# Patient Record
Sex: Male | Born: 1985 | Race: White | Hispanic: No | Marital: Married | Smoking: Former smoker
Health system: Southern US, Community
[De-identification: ages and names within clinical notes are randomized; demographics above are authoritative.]

## PROBLEM LIST (undated history)

## (undated) DIAGNOSIS — K219 Gastro-esophageal reflux disease without esophagitis: Secondary | ICD-10-CM

## (undated) DIAGNOSIS — I82409 Acute embolism and thrombosis of unspecified deep veins of unspecified lower extremity: Secondary | ICD-10-CM

## (undated) DIAGNOSIS — N200 Calculus of kidney: Secondary | ICD-10-CM

## (undated) DIAGNOSIS — I10 Essential (primary) hypertension: Secondary | ICD-10-CM

## (undated) HISTORY — PX: ANKLE SURGERY: SHX546

## (undated) HISTORY — PX: MOUTH SURGERY: SHX715

## (undated) HISTORY — DX: Gastro-esophageal reflux disease without esophagitis: K21.9

---

## 2019-10-11 ENCOUNTER — Emergency Department (HOSPITAL_COMMUNITY): Payer: Medicaid Other

## 2019-10-11 ENCOUNTER — Encounter (HOSPITAL_COMMUNITY): Payer: Self-pay | Admitting: *Deleted

## 2019-10-11 ENCOUNTER — Other Ambulatory Visit: Payer: Self-pay

## 2019-10-11 ENCOUNTER — Emergency Department (HOSPITAL_COMMUNITY)
Admission: EM | Admit: 2019-10-11 | Discharge: 2019-10-11 | Disposition: A | Discharge status: Home / Self Care | Payer: Medicaid Other | Attending: Emergency Medicine | Admitting: Emergency Medicine

## 2019-10-11 DIAGNOSIS — Y939 Activity, unspecified: Secondary | ICD-10-CM | POA: Diagnosis not present

## 2019-10-11 DIAGNOSIS — I10 Essential (primary) hypertension: Secondary | ICD-10-CM | POA: Diagnosis not present

## 2019-10-11 DIAGNOSIS — F17200 Nicotine dependence, unspecified, uncomplicated: Secondary | ICD-10-CM | POA: Diagnosis not present

## 2019-10-11 DIAGNOSIS — Y929 Unspecified place or not applicable: Secondary | ICD-10-CM | POA: Insufficient documentation

## 2019-10-11 DIAGNOSIS — W1842XA Slipping, tripping and stumbling without falling due to stepping into hole or opening, initial encounter: Secondary | ICD-10-CM | POA: Diagnosis not present

## 2019-10-11 DIAGNOSIS — S93402A Sprain of unspecified ligament of left ankle, initial encounter: Secondary | ICD-10-CM | POA: Insufficient documentation

## 2019-10-11 DIAGNOSIS — Y999 Unspecified external cause status: Secondary | ICD-10-CM | POA: Insufficient documentation

## 2019-10-11 DIAGNOSIS — S99912A Unspecified injury of left ankle, initial encounter: Secondary | ICD-10-CM | POA: Diagnosis present

## 2019-10-11 HISTORY — DX: Essential (primary) hypertension: I10

## 2019-10-11 MED ORDER — KETOROLAC TROMETHAMINE 60 MG/2ML IM SOLN
60.0000 mg | Freq: Once | INTRAMUSCULAR | Status: AC
  Administered 2019-10-11: 60 mg via INTRAMUSCULAR
  Filled 2019-10-11: qty 2

## 2019-10-11 NOTE — Discharge Instructions (Addendum)
Please follow-up with your primary care provider regarding today's encounter.  I recommend that you take ibuprofen and Tylenol as needed for symptoms discomfort.  Please use the crutches, as needed.  Weightbearing as tolerated.  Your x-ray was negative for fracture today.  Suspect this is a sprain.  However, there is always the possibility of missed fractures on x-ray.  I referred you to Dr. Aline Brochure, orthopedist, for ongoing evaluation and management if your symptoms fail to improve with conservative therapy.  Return to the ED or seek immediate medical attention should experience any new or worsening symptoms.

## 2019-10-11 NOTE — ED Provider Notes (Signed)
[facility] Provider Note   CSN: [id number] Arrival date & time: 10/11/19  1216     History Chief Complaint  Patient presents with  . Ankle Pain    Mark Glenn is a 34 y.o. male with no relevant past medical history who presents to the ED with complaints of left ankle pain.  Patient reports that he had his surgically repaired right ankle, but has never had any issues with his left ankle.  He was working [place] at a wrestling event yesterday when he was stepping backwards with his left foot and it "gave out" underneath him.  He states that he was able to finish the event.  This morning, he noticed swelling and discomfort.  His wife encouraged him to come to the ED for evaluation.  He states that he has been unable to ambulate without a brace, however was able to walk into the ER here today for evaluation.  He wanted to ensure that there were no fractures.  He denies any weakness, numbness, bruising, blood thinner use, fevers or chills, or other symptoms.  HPI     Past Medical History:  Diagnosis Date  . Hypertension     There are no problems to display for this patient.   Past Surgical History:  Procedure Laterality Date  . ANKLE SURGERY     right  . MOUTH SURGERY         No family history on file.  Social History   Tobacco Use  . Smoking status: Current Every Day Smoker  . Smokeless tobacco: Never Used  Substance Use Topics  . Alcohol use: Not on file  . Drug use: Not on file    Home Medications Prior to Admission medications   Not on File    Allergies    Patient has no known allergies.  Review of Systems   Review of Systems  Constitutional: Negative for fever.  Musculoskeletal: Positive for arthralgias, gait problem and joint swelling.  Skin: Negative for color change and wound.  Neurological: Negative for weakness and numbness.  Hematological: Does not bruise/bleed easily.    Physical Exam Updated Vital Signs BP 138/73    Pulse 62   Temp 98.1 F (36.7 C) (Oral)   Resp 16   Ht 5' 9"  (1.753 m)   Wt 98.4 kg   SpO2 100%   BMI 32.05 kg/m   Physical Exam Vitals and nursing note reviewed. Exam conducted with a chaperone present.  Constitutional:      Appearance: Normal appearance.  HENT:     Head: Normocephalic and atraumatic.  Eyes:     General: No scleral icterus.    Conjunctiva/sclera: Conjunctivae normal.  Cardiovascular:     Rate and Rhythm: Normal rate and regular rhythm.     Pulses: Normal pulses.     Heart sounds: Normal heart sounds.  Pulmonary:     Effort: Pulmonary effort is normal.  Musculoskeletal:     Cervical back: Normal range of motion. No rigidity.     Comments: Left ankle: TTP over lateral malleolus.  Able to plantar flex and dorsiflex, but limited due to pain symptoms.  No significant swelling or overlying skin changes appreciated.  No warmth or redness.  Able to ambulate, albeit with antalgia.  Negative Thompson's test.  No Achilles defect or tenderness.  Sensation intact throughout.  Pedal pulse and capillary refill intact.  No wounds. Left knee: Normal.  No proximal fibular tenderness. Left hip: Normal.  Skin:    General:  Skin is dry.     Capillary Refill: Capillary refill takes less than 2 seconds.  Neurological:     General: No focal deficit present.     Mental Status: He is alert and oriented to person, place, and time.     GCS: GCS eye subscore is 4. GCS verbal subscore is 5. GCS motor subscore is 6.  Psychiatric:        Mood and Affect: Mood normal.        Behavior: Behavior normal.        Thought Content: Thought content normal.     ED Results / Procedures / Treatments   Labs (all labs ordered are listed, but only abnormal results are displayed) Labs Reviewed - No data to display  EKG None  Radiology DG Ankle Complete Left  Result Date: 10/11/2019 CLINICAL DATA:  Pain in LEFT ankle. EXAM: LEFT ANKLE COMPLETE - 3+ VIEW COMPARISON:  None FINDINGS: Degenerative  changes about the LEFT ankle. No significant soft tissue swelling. Ankle mortise is preserved. No sign of joint effusion. IMPRESSION: No acute fracture or dislocation. Degenerative changes about the LEFT ankle. Electronically Signed   By: Zetta Bills M.D.   On: 10/11/2019 13:36    Procedures Procedures (including critical care time)  Medications Ordered in ED Medications  ketorolac (TORADOL) injection 60 mg (has no administration in time range)    ED Course  I have reviewed the triage vital signs and the nursing notes.  Pertinent labs & imaging results that were available during my care of the patient were reviewed by me and considered in my medical decision making (see chart for details).    MDM Rules/Calculators/A&P                          Plain films are personally reviewed and demonstrate no acute fractures or other osseous abnormalities.  There are some mild degenerative changes noted.  Patient endorses a history of playing football and other strenuous vigorous activity that he suspects may be contributing factor to arthritic changes.  Patient is reassured by today's work-up.  I cautioned him that there is always a risk of missed fractures on plain films.  Will place in ASO splint and provide crutches.  Will provide Toradol IM here in the ED.  Will refer to local orthopedist, Dr. Aline Brochure, for ongoing evaluation and management.  Strict ED return precautions discussed.  Patient voices understanding and is agreeable to the plan.     Final Clinical Impression(s) / ED Diagnoses Final diagnoses:  Sprain of left ankle, unspecified ligament, initial encounter    Rx / DC Orders ED Discharge Orders    None       Corena Herter, PA-C 10/11/19 1519    Noemi Chapel, MD 10/12/19 [phone]

## 2019-10-11 NOTE — ED Triage Notes (Signed)
Pt c/o "stepping wrong" on left ankle last night.

## 2020-03-13 ENCOUNTER — Emergency Department (HOSPITAL_COMMUNITY)
Admission: EM | Admit: 2020-03-13 | Discharge: 2020-03-13 | Disposition: A | Discharge status: Home / Self Care | Payer: Medicaid Other | Attending: Emergency Medicine | Admitting: Emergency Medicine

## 2020-03-13 ENCOUNTER — Other Ambulatory Visit: Payer: Self-pay

## 2020-03-13 ENCOUNTER — Encounter (HOSPITAL_COMMUNITY): Payer: Self-pay

## 2020-03-13 DIAGNOSIS — Z5321 Procedure and treatment not carried out due to patient leaving prior to being seen by health care provider: Secondary | ICD-10-CM | POA: Diagnosis not present

## 2020-03-13 DIAGNOSIS — K0889 Other specified disorders of teeth and supporting structures: Secondary | ICD-10-CM | POA: Diagnosis not present

## 2020-03-13 NOTE — ED Triage Notes (Signed)
Pt to er, pt states that he is here for dental pain, states that he has had the pain for the past week, states that he is trying to get into [facility], states that he is taking motrin and Orajel without relief.

## 2020-04-13 ENCOUNTER — Encounter (HOSPITAL_COMMUNITY): Payer: Self-pay

## 2020-04-13 ENCOUNTER — Emergency Department (HOSPITAL_COMMUNITY): Payer: Medicaid Other

## 2020-04-13 ENCOUNTER — Other Ambulatory Visit: Payer: Self-pay

## 2020-04-13 ENCOUNTER — Emergency Department (HOSPITAL_COMMUNITY)
Admission: EM | Admit: 2020-04-13 | Discharge: 2020-04-13 | Discharge status: Against Medical Advice | Payer: Medicaid Other | Attending: Emergency Medicine | Admitting: Emergency Medicine

## 2020-04-13 DIAGNOSIS — I10 Essential (primary) hypertension: Secondary | ICD-10-CM | POA: Insufficient documentation

## 2020-04-13 DIAGNOSIS — Z79899 Other long term (current) drug therapy: Secondary | ICD-10-CM | POA: Insufficient documentation

## 2020-04-13 DIAGNOSIS — I160 Hypertensive urgency: Secondary | ICD-10-CM | POA: Diagnosis not present

## 2020-04-13 DIAGNOSIS — R0789 Other chest pain: Secondary | ICD-10-CM | POA: Diagnosis present

## 2020-04-13 DIAGNOSIS — F17210 Nicotine dependence, cigarettes, uncomplicated: Secondary | ICD-10-CM | POA: Diagnosis not present

## 2020-04-13 LAB — TROPONIN I (HIGH SENSITIVITY)
Troponin I (High Sensitivity): 5 ng/L (ref <18)
Troponin I (High Sensitivity): 6 ng/L (ref <18)

## 2020-04-13 LAB — CBC
HCT: 42.5 % (ref 39.0–52.0)
Hemoglobin: 14.6 g/dL (ref 13.0–17.0)
MCH: 30.8 pg (ref 26.0–34.0)
MCHC: 34.4 g/dL (ref 30.0–36.0)
MCV: 89.7 fL (ref 80.0–100.0)
Platelets: 274 10*3/uL (ref 150–400)
RBC: 4.74 MIL/uL (ref 4.22–5.81)
RDW: 12.7 % (ref 11.5–15.5)
WBC: 9.3 10*3/uL (ref 4.0–10.5)
nRBC: 0 % (ref 0.0–0.2)

## 2020-04-13 LAB — HEPATIC FUNCTION PANEL
ALT: 29 U/L (ref 0–44)
AST: 22 U/L (ref 15–41)
Albumin: 4.1 g/dL (ref 3.5–5.0)
Alkaline Phosphatase: 43 U/L (ref 38–126)
Bilirubin, Direct: <0.1 mg/dL (ref 0.0–0.2)
Total Bilirubin: 0.6 mg/dL (ref 0.3–1.2)
Total Protein: 6.8 g/dL (ref 6.5–8.1)

## 2020-04-13 LAB — BASIC METABOLIC PANEL
Anion gap: 8 (ref 5–15)
BUN: 14 mg/dL (ref 6–20)
CO2: 25 mmol/L (ref 22–32)
Calcium: 9.2 mg/dL (ref 8.9–10.3)
Chloride: 105 mmol/L (ref 98–111)
Creatinine, Ser: 1.05 mg/dL (ref 0.61–1.24)
GFR, Estimated: >60 mL/min (ref >60)
Glucose, Bld: 89 mg/dL (ref 70–99)
Potassium: 4 mmol/L (ref 3.5–5.1)
Sodium: 138 mmol/L (ref 135–145)

## 2020-04-13 LAB — LIPASE, BLOOD: Lipase: 25 U/L (ref 11–51)

## 2020-04-13 MED ORDER — IOHEXOL 350 MG/ML SOLN
100.0000 mL | Freq: Once | INTRAVENOUS | Status: AC | PRN
  Administered 2020-04-13: 100 mL via INTRAVENOUS

## 2020-04-13 MED ORDER — HYDRALAZINE HCL 20 MG/ML IJ SOLN
10.0000 mg | Freq: Once | INTRAMUSCULAR | Status: AC
  Administered 2020-04-13: 10 mg via INTRAVENOUS
  Filled 2020-04-13: qty 1

## 2020-04-13 MED ORDER — NITROGLYCERIN 0.4 MG SL SUBL
0.4000 mg | SUBLINGUAL_TABLET | Freq: Once | SUBLINGUAL | Status: AC
  Administered 2020-04-13: 0.4 mg via SUBLINGUAL
  Filled 2020-04-13: qty 1

## 2020-04-13 NOTE — ED Provider Notes (Signed)
[facility] Provider Note   CSN: [id number] Arrival date & time: 04/13/20  1710     History Chief Complaint  Patient presents with  . Chest Pain    Mark Glenn is a 35 y.o. male with PMHx HTN (currently on Lisinopril HCTZ 10-12.5) who presents to the ED today with complaint of chest pain. Pt reports that yesterday he began feeling "ill" with diffuse chest tightness with intermittent radiation of pain down his LUE/tingling in his left hand. He also complains of dyspnea on exertion and diaphoresis. He left work early yesterday and bought a blood pressure cuff and noted his BP to be elevated in the 160/110 range and has continued to increase since then. Pt states he used to be on Toprol XL however was changed to Lisinocpril HCTZ last year at the [facility]. He does not check his blood pressure regularly and unable to provide a range - states that sometimes he will go to [facility] and check his BP there which is typically in the 179X systolic. Pt also mentions that he has been having intermittent epigastric abdominal discomfort for the past week - has been taking TUMS and other OTC medications without relief. Pt mentions that his father passed away at the age of 59-36 due to congestive heart failure. No other family history of CAD. Pt is a current everyday smoker, smokes about 0.5 ppd. He also reports history of "clotting disorder" in his family however not able to state which one specifically. No history DVT/PE personally for patient. No recent prolonged travel or immobilization. No hemoptysis. No active malignancy. No exogenous hormone use.    The history is provided by the patient and medical records.       Past Medical History:  Diagnosis Date  . Hypertension     There are no problems to display for this patient.   Past Surgical History:  Procedure Laterality Date  . ANKLE SURGERY     right  . MOUTH SURGERY         History reviewed. No pertinent family  history.  Social History   Tobacco Use  . Smoking status: Current Every Day Smoker    Packs/day: 0.50    Types: Cigarettes  . Smokeless tobacco: Never Used  Vaping Use  . Vaping Use: Never used  Substance Use Topics  . Alcohol use: Yes  . Drug use: Never    Home Medications Prior to Admission medications   Medication Sig Start Date End Date Taking? Authorizing Provider  ibuprofen (ADVIL) 200 MG tablet Take 200 mg by mouth every 6 (six) hours as needed.   Yes [provider]  lisinopril-hydrochlorothiazide (ZESTORETIC) 20-12.5 MG tablet Take 1 tablet by mouth daily. 11/19/19  Yes [provider]    Allergies    Patient has no known allergies.  Review of Systems   Review of Systems  Constitutional: Positive for diaphoresis. Negative for chills and fever.  Eyes: Negative for visual disturbance.  Respiratory: Positive for shortness of breath.   Cardiovascular: Positive for chest pain.  Gastrointestinal: Negative for nausea and vomiting.  Neurological: Negative for dizziness, weakness, light-headedness, numbness and headaches.  All other systems reviewed and are negative.   Physical Exam Updated Vital Signs BP (!) 209/119 (BP Location: Right Arm)   Pulse 69   Temp 98.7 F (37.1 C) (Oral)   Resp 18   Ht 5' 10"  (1.778 m)   Wt 104.3 kg   SpO2 100%   BMI 33.00 kg/m  Physical Exam Vitals and nursing note reviewed.  Constitutional:      Appearance: He is not ill-appearing or diaphoretic.  HENT:     Head: Normocephalic and atraumatic.  Eyes:     Conjunctiva/sclera: Conjunctivae normal.  Cardiovascular:     Rate and Rhythm: Normal rate and regular rhythm.     Pulses:          Radial pulses are 2+ on the right side and 2+ on the left side.       Posterior tibial pulses are 2+ on the right side and 2+ on the left side.     Heart sounds: Normal heart sounds.  Pulmonary:     Effort: Pulmonary effort is normal.     Breath sounds: Normal breath sounds.  No decreased breath sounds, wheezing, rhonchi or rales.  Chest:     Chest wall: No tenderness.  Abdominal:     Palpations: Abdomen is soft.     Tenderness: There is no abdominal tenderness. There is no guarding or rebound.  Musculoskeletal:     Cervical back: Neck supple.  Skin:    General: Skin is warm and dry.  Neurological:     Mental Status: He is alert.     ED Results / Procedures / Treatments   Labs (all labs ordered are listed, but only abnormal results are displayed) Labs Reviewed  BASIC METABOLIC PANEL  CBC  LIPASE, BLOOD  HEPATIC FUNCTION PANEL  TROPONIN I (HIGH SENSITIVITY)  TROPONIN I (HIGH SENSITIVITY)    EKG EKG Interpretation  Date/Time:  Wednesday April 13 2020 17:16:04 EST Ventricular Rate:  73 PR Interval:  140 QRS Duration: 86 QT Interval:  408 QTC Calculation: 449 R Axis:   46 Text Interpretation: Normal sinus rhythm Normal ECG NSR, nonspecific T wave changes Confirmed by Lavenia Atlas [phone]) on 04/13/2020 5:28:45 PM   Radiology DG Chest 2 View  Result Date: 04/13/2020 CLINICAL DATA:  Hypertension, chest pain EXAM: CHEST - 2 VIEW COMPARISON:  None. FINDINGS: The heart size and mediastinal contours are within normal limits. Both lungs are clear. The visualized skeletal structures are unremarkable. IMPRESSION: No active cardiopulmonary disease. Electronically Signed   By: Randa Ngo M.D.   On: 04/13/2020 17:57   CT Angio Chest/Abd/Pel for Dissection W and/or W/WO  Result Date: 04/13/2020 CLINICAL DATA:  35 year old male with abdominal pain. Concern for aortic dissection. EXAM: CT ANGIOGRAPHY CHEST, ABDOMEN AND PELVIS TECHNIQUE: Non-contrast CT of the chest was initially obtained. Multidetector CT imaging through the chest, abdomen and pelvis was performed using the standard protocol during bolus administration of intravenous contrast. Multiplanar reconstructed images and MIPs were obtained and reviewed to evaluate the vascular anatomy.  CONTRAST:  170m OMNIPAQUE IOHEXOL 350 MG/ML SOLN COMPARISON:  Chest radiograph dated 04/13/2020. FINDINGS: CTA CHEST FINDINGS Cardiovascular: There is no cardiomegaly or pericardial effusion. The thoracic aorta is unremarkable. The origins of the great vessels of the aortic arch appear patent. The central pulmonary arteries appear patent for the degree of opacification. Mediastinum/Nodes: No hilar or mediastinal adenopathy. The esophagus and the thyroid gland are grossly unremarkable. No mediastinal fluid collection. Lungs/Pleura: The lungs are clear. There is no pleural effusion pneumothorax. The central airways are patent. Musculoskeletal: No chest wall abnormality. No acute or significant osseous findings. Review of the MIP images confirms the above findings. CTA ABDOMEN AND PELVIS FINDINGS VASCULAR Aorta: Normal caliber aorta without aneurysm, dissection, vasculitis or significant stenosis. Celiac: Patent without evidence of aneurysm, dissection, vasculitis or significant stenosis. SMA: Patent without  evidence of aneurysm, dissection, vasculitis or significant stenosis. Renals: Both renal arteries are patent without evidence of aneurysm, dissection, vasculitis, fibromuscular dysplasia or significant stenosis. There is a small accessory left renal artery superior to the main left renal artery. IMA: Patent without evidence of aneurysm, dissection, vasculitis or significant stenosis. Inflow: Patent without evidence of aneurysm, dissection, vasculitis or significant stenosis. Veins: No obvious venous abnormality within the limitations of this arterial phase study. Review of the MIP images confirms the above findings. NON-VASCULAR No intra-abdominal free air or free fluid. Hepatobiliary: No focal liver abnormality is seen. No gallstones, gallbladder wall thickening, or biliary dilatation. Pancreas: Unremarkable. No pancreatic ductal dilatation or surrounding inflammatory changes. Spleen: Normal in size without focal  abnormality. Adrenals/Urinary Tract: Adrenal glands are unremarkable. Kidneys are normal, without renal calculi, focal lesion, or hydronephrosis. Bladder is unremarkable. Stomach/Bowel: There is no bowel obstruction or active inflammation. The appendix is normal. Lymphatic: No adenopathy. Reproductive: The prostate and seminal vesicles are grossly unremarkable. Other: None Musculoskeletal: No acute or significant osseous findings. Review of the MIP images confirms the above findings. IMPRESSION: No acute intrathoracic, abdominal, or pelvic pathology. No CT evidence of aortic dissection or aneurysm. Electronically Signed   By: Anner Crete M.D.   On: 04/13/2020 20:11    Procedures Procedures   Medications Ordered in ED Medications  nitroGLYCERIN (NITROSTAT) SL tablet 0.4 mg (0.4 mg Sublingual Given 04/13/20 1859)  iohexol (OMNIPAQUE) 350 MG/ML injection 100 mL (100 mLs Intravenous Contrast Given 04/13/20 1930)  hydrALAZINE (APRESOLINE) injection 10 mg (10 mg Intravenous Given 04/13/20 2025)    ED Course  I have reviewed the triage vital signs and the nursing notes.  Pertinent labs & imaging results that were available during my care of the patient were reviewed by me and considered in my medical decision making (see chart for details).    MDM Rules/Calculators/A&P                          35 year old male presents to the ED today complaining of diffuse chest tightness and elevated blood pressure with associated diaphoresis/shortness of breath on exertion for the past day.  He is on lisinopril-HCTZ for the past year.  On arrival to the ED patient is afebrile, nontachypneic and nontachycardic.  Blood pressure significantly elevated at 225/139.  An EKG was obtained prior to patient being seen, no acute ischemic changes.  Chest x-ray obtained prior to being seen which did not show any acute abnormalities.  On my exam patient appears comfortable at this time.  He has no chest tenderness palpation or  abdominal tenderness palpation.  He has equal pulses to his radial pulses bilaterally and PT pulses bilaterally.  He is having a little bit of chest tightness at this time however does report is worse with exertion.  No other symptoms currently.  We will plan to work-up for ACS at this time.  Given abdominal/chest pain with elevated blood pressure and tingling in his left arm there is concern for possible dissection, will dissection study today.  Will provide nitroglycerin to see if this helps with patient's chest pain as well as decrease blood pressure.  If no decrease in blood pressure may consider giving IV hydralazine.  Question if patient is having hypertensive urgency at this time with his high blood pressure and chest pain.  May require admission depending on work-up today and if blood pressure is not able to be controlled.  CBC without leukocytosis. Hgb stable  at 14.6 BMP without electrolyte abnormalities Troponin of 5. Will repeat.  LFTs and lipase unremarkable today  No significant decrease in BP after 1 dose of NTG; IV hydralazine ordered at this time. Will continue to monitor.   CTA negative for dissection or other acute abnormalities  Repeat troponin 6. On reevaluation pt's blood pressure 166/106 which he reports is near his baseline. He states his chest pain went away after the IV hydralazine. Given pt's concerning story of diaphoresis and chest pain with exertion with risk factors of HTN, smoking, and family history of CAD do feel pt requires admission at this time. I had discussed admission with pt however he ultimately declined due to child care. I had lengthy discussion with pt regarding risks of leaving without observation and further work up and he understands these risks including death and ultimately decided to sign out AMA.   This note was prepared using Dragon voice recognition software and may include unintentional dictation errors due to the inherent limitations of voice  recognition software.  Final Clinical Impression(s) / ED Diagnoses Final diagnoses:  Hypertensive urgency  Atypical chest pain    Rx / DC Orders ED Discharge Orders    None       Eustaquio Maize, PA-C 04/14/20 0016    Lorelle Gibbs, DO 04/14/20 1545

## 2020-04-13 NOTE — ED Notes (Signed)
Pt to CT

## 2020-04-13 NOTE — ED Triage Notes (Signed)
Pt to er, pt states that he is currently on htn medications, states that he is here because he is hypertensive, states that he took his medication today and it was still elevated, states that he is also having some chest pain.  Pt states that nothing seems to make his pain better or worse./

## 2020-06-07 ENCOUNTER — Emergency Department (HOSPITAL_COMMUNITY)
Admission: EM | Admit: 2020-06-07 | Discharge: 2020-06-07 | Disposition: A | Discharge status: Home / Self Care | Payer: Medicaid Other | Attending: Emergency Medicine | Admitting: Emergency Medicine

## 2020-06-07 ENCOUNTER — Other Ambulatory Visit: Payer: Self-pay

## 2020-06-07 ENCOUNTER — Emergency Department (HOSPITAL_COMMUNITY): Payer: Medicaid Other

## 2020-06-07 ENCOUNTER — Encounter (HOSPITAL_COMMUNITY): Payer: Self-pay

## 2020-06-07 DIAGNOSIS — Z79899 Other long term (current) drug therapy: Secondary | ICD-10-CM | POA: Diagnosis not present

## 2020-06-07 DIAGNOSIS — I10 Essential (primary) hypertension: Secondary | ICD-10-CM | POA: Insufficient documentation

## 2020-06-07 DIAGNOSIS — N200 Calculus of kidney: Secondary | ICD-10-CM | POA: Insufficient documentation

## 2020-06-07 DIAGNOSIS — F17210 Nicotine dependence, cigarettes, uncomplicated: Secondary | ICD-10-CM | POA: Insufficient documentation

## 2020-06-07 DIAGNOSIS — R109 Unspecified abdominal pain: Secondary | ICD-10-CM | POA: Diagnosis present

## 2020-06-07 DIAGNOSIS — N132 Hydronephrosis with renal and ureteral calculous obstruction: Secondary | ICD-10-CM

## 2020-06-07 HISTORY — DX: Calculus of kidney: N20.0

## 2020-06-07 LAB — CBC WITH DIFFERENTIAL/PLATELET
Abs Immature Granulocytes: 0.04 10*3/uL (ref 0.00–0.07)
Basophils Absolute: 0.1 10*3/uL (ref 0.0–0.1)
Basophils Relative: 0 %
Eosinophils Absolute: 0.2 10*3/uL (ref 0.0–0.5)
Eosinophils Relative: 1 %
HCT: 41.7 % (ref 39.0–52.0)
Hemoglobin: 14.5 g/dL (ref 13.0–17.0)
Immature Granulocytes: 0 %
Lymphocytes Relative: 11 %
Lymphs Abs: 1.5 10*3/uL (ref 0.7–4.0)
MCH: 30.8 pg (ref 26.0–34.0)
MCHC: 34.8 g/dL (ref 30.0–36.0)
MCV: 88.5 fL (ref 80.0–100.0)
Monocytes Absolute: 0.6 10*3/uL (ref 0.1–1.0)
Monocytes Relative: 5 %
Neutro Abs: 10.7 10*3/uL — ABNORMAL HIGH (ref 1.7–7.7)
Neutrophils Relative %: 83 %
Platelets: 258 10*3/uL (ref 150–400)
RBC: 4.71 MIL/uL (ref 4.22–5.81)
RDW: 12.9 % (ref 11.5–15.5)
WBC: 13.1 10*3/uL — ABNORMAL HIGH (ref 4.0–10.5)
nRBC: 0 % (ref 0.0–0.2)

## 2020-06-07 LAB — BASIC METABOLIC PANEL
Anion gap: 9 (ref 5–15)
BUN: 12 mg/dL (ref 6–20)
CO2: 25 mmol/L (ref 22–32)
Calcium: 9 mg/dL (ref 8.9–10.3)
Chloride: 106 mmol/L (ref 98–111)
Creatinine, Ser: 1.38 mg/dL — ABNORMAL HIGH (ref 0.61–1.24)
GFR, Estimated: >60 mL/min (ref >60)
Glucose, Bld: 131 mg/dL — ABNORMAL HIGH (ref 70–99)
Potassium: 3.8 mmol/L (ref 3.5–5.1)
Sodium: 140 mmol/L (ref 135–145)

## 2020-06-07 LAB — URINALYSIS, ROUTINE W REFLEX MICROSCOPIC
Bilirubin Urine: NEGATIVE
Glucose, UA: NEGATIVE mg/dL
Hgb urine dipstick: NEGATIVE
Ketones, ur: NEGATIVE mg/dL
Leukocytes,Ua: NEGATIVE
Nitrite: NEGATIVE
Protein, ur: NEGATIVE mg/dL
Specific Gravity, Urine: 1.011 (ref 1.005–1.030)
pH: 9 — ABNORMAL HIGH (ref 5.0–8.0)

## 2020-06-07 MED ORDER — SODIUM CHLORIDE 0.9 % IV BOLUS
1000.0000 mL | Freq: Once | INTRAVENOUS | Status: AC
  Administered 2020-06-07: 1000 mL via INTRAVENOUS

## 2020-06-07 MED ORDER — KETOROLAC TROMETHAMINE 30 MG/ML IJ SOLN
15.0000 mg | Freq: Once | INTRAMUSCULAR | Status: AC
  Administered 2020-06-07: 15 mg via INTRAVENOUS
  Filled 2020-06-07: qty 1

## 2020-06-07 MED ORDER — ONDANSETRON HCL 4 MG/2ML IJ SOLN
4.0000 mg | Freq: Once | INTRAMUSCULAR | Status: AC
  Administered 2020-06-07: 4 mg via INTRAVENOUS
  Filled 2020-06-07: qty 2

## 2020-06-07 MED ORDER — TAMSULOSIN HCL 0.4 MG PO CAPS: 0.4000 mg | ORAL_CAPSULE | Freq: Every day | ORAL | 0 refills | Status: AC

## 2020-06-07 MED ORDER — ONDANSETRON 4 MG PO TBDP: 4.0000 mg | ORAL_TABLET | Freq: Three times a day (TID) | ORAL | 0 refills | Status: DC | PRN

## 2020-06-07 MED ORDER — MORPHINE SULFATE (PF) 4 MG/ML IV SOLN
4.0000 mg | Freq: Once | INTRAVENOUS | Status: AC
  Administered 2020-06-07: 4 mg via INTRAVENOUS
  Filled 2020-06-07: qty 1

## 2020-06-07 MED ORDER — OXYCODONE-ACETAMINOPHEN 5-325 MG PO TABS: 1.0000 | ORAL_TABLET | Freq: Four times a day (QID) | ORAL | 0 refills | Status: DC | PRN

## 2020-06-07 NOTE — ED Triage Notes (Signed)
Patient complaining of left flank pain that started yesterday and has worsened overnight. Reports N/V. Complains of dysuria. HX of kidney stones.

## 2020-06-07 NOTE — Discharge Instructions (Addendum)
Please pick up medications and take as prescribed.  I have provided you with pain medication to take as needed.  I would recommend taking ibuprofen for pain and then to take the narcotic pain medication for breakthrough.  I have also prescribed nausea medication to take as needed.  Please take the Flomax daily to help with creasing urination to push the kidney stone out.  It is recommended that you increase your oral intake to help with this as well.  Follow-up with [facility] regarding your ED visit today.  You will need to call them to schedule an appointment.  Return to the ED immediately for any worsening symptoms including fevers greater than 100.4 as well as inability to urinate.

## 2020-06-07 NOTE — ED Notes (Signed)
Patient to CT at this time

## 2020-06-07 NOTE — ED Provider Notes (Signed)
[facility] EMERGENCY DEPARTMENT Provider Note   CSN: [id number] Arrival date & time: 06/07/20  0850     History Chief Complaint  Patient presents with  . Flank Pain    Mark Glenn is a 35 y.o. male past medical history hypertension and kidney stones who presents to the ED today with complaint of gradual onset, constant, worsening, left-sided flank pain radiating into left lower quadrant for the past day.  He also reports nausea and nonbloody nonbilious emesis.  Patient reports he is urinating less often than normal and he is having dysuria with urination.  He reports history of kidney stones however states this feels much more severe.  He was told in the past that he should be able to pass the kidney stone on his own however he never noticed it passing with urination.  He has never had to have a urinary stenting or lithotripsy for stone break-up.  He denies any fevers or chills.  He has taken over-the-counter medications without much relief and states he has been unable to keep anything down prompting him to come to the ED today.  No previous abdominal surgeries.  Denies any heavy alcohol use.   The history is provided by the patient and medical records.       Past Medical History:  Diagnosis Date  . Hypertension   . Kidney calculus     There are no problems to display for this patient.   Past Surgical History:  Procedure Laterality Date  . ANKLE SURGERY     right  . MOUTH SURGERY         No family history on file.  Social History   Tobacco Use  . Smoking status: Current Every Day Smoker    Packs/day: 0.50    Types: Cigarettes  . Smokeless tobacco: Never Used  Vaping Use  . Vaping Use: Never used  Substance Use Topics  . Alcohol use: Yes  . Drug use: Never    Home Medications Prior to Admission medications   Medication Sig Start Date End Date Taking? Authorizing Provider  ondansetron (ZOFRAN ODT) 4 MG disintegrating tablet Take 1 tablet (4 mg total) by mouth  every 8 (eight) hours as needed for nausea or vomiting. 06/07/20  Yes Antonique Langford, PA-C  oxyCODONE-acetaminophen (PERCOCET/ROXICET) 5-325 MG tablet Take 1 tablet by mouth every 6 (six) hours as needed for severe pain. 06/07/20  Yes Doyl Bitting, PA-C  tamsulosin (FLOMAX) 0.4 MG CAPS capsule Take 1 capsule (0.4 mg total) by mouth daily for 7 days. 06/07/20 06/14/20 Yes Cadee Agro, PA-C  ibuprofen (ADVIL) 200 MG tablet Take 200 mg by mouth every 6 (six) hours as needed.    [provider]  lisinopril-hydrochlorothiazide (ZESTORETIC) 20-12.5 MG tablet Take 1 tablet by mouth daily. 11/19/19   [provider]    Allergies    Patient has no known allergies.  Review of Systems   Review of Systems  Constitutional: Negative for chills and fever.  Gastrointestinal: Positive for abdominal pain, nausea and vomiting.  Genitourinary: Positive for decreased urine volume, dysuria and flank pain.  All other systems reviewed and are negative.   Physical Exam Updated Vital Signs BP (!) 219/139   Pulse (!) 53   Ht 5' 11"  (1.803 m)   Wt 99.8 kg   SpO2 99%   BMI 30.68 kg/m   Physical Exam Vitals and nursing note reviewed.  Constitutional:      Appearance: He is diaphoretic.     Comments: Uncomfortable appearing  male; clutching left side of abdomen  HENT:     Head: Normocephalic and atraumatic.  Eyes:     Conjunctiva/sclera: Conjunctivae normal.  Cardiovascular:     Rate and Rhythm: Normal rate and regular rhythm.     Pulses: Normal pulses.  Pulmonary:     Effort: Pulmonary effort is normal.     Breath sounds: Normal breath sounds. No wheezing, rhonchi or rales.  Abdominal:     Palpations: Abdomen is soft.     Tenderness: There is abdominal tenderness. There is left CVA tenderness. There is no guarding or rebound.     Comments: Soft, + LLQ and left CVA TTP, +BS throughout, no r/g/r, neg murphy's, neg mcburney's  Musculoskeletal:     Cervical back: Neck supple.   Skin:    General: Skin is warm.  Neurological:     Mental Status: He is alert.     ED Results / Procedures / Treatments   Labs (all labs ordered are listed, but only abnormal results are displayed) Labs Reviewed  BASIC METABOLIC PANEL - Abnormal; Notable for the following components:      Result Value   Glucose, Bld 131 (*)    Creatinine, Ser 1.38 (*)    All other components within normal limits  CBC WITH DIFFERENTIAL/PLATELET - Abnormal; Notable for the following components:   WBC 13.1 (*)    Neutro Abs 10.7 (*)    All other components within normal limits  URINALYSIS, ROUTINE W REFLEX MICROSCOPIC - Abnormal; Notable for the following components:   APPearance HAZY (*)    pH 9.0 (*)    All other components within normal limits    EKG None  Radiology CT Renal Stone Study  Result Date: 06/07/2020 CLINICAL DATA:  35 year old male with history of left-sided flank pain for 1 day. EXAM: CT ABDOMEN AND PELVIS WITHOUT CONTRAST TECHNIQUE: Multidetector CT imaging of the abdomen and pelvis was performed following the standard protocol without IV contrast. COMPARISON:  No priors. FINDINGS: Lower chest: Unremarkable. Hepatobiliary: No suspicious cystic or solid hepatic lesions are confidently identified on today's noncontrast CT examination. Unenhanced appearance of the gallbladder is normal. Pancreas: No definite pancreatic mass or peripancreatic fluid collections or inflammatory changes are noted on today's noncontrast CT examination. Spleen: Unremarkable. Adrenals/Urinary Tract: 6 mm calculus at the left ureterovesicular junction with mild proximal left hydroureteronephrosis. No additional calculi are noted within the collecting system of either kidney, along the course of the right ureter, or within the lumen of the urinary bladder. No right-sided hydroureteronephrosis. Unenhanced appearance of the kidneys, bilateral adrenal glands and urinary bladder are otherwise unremarkable.  Stomach/Bowel: Unenhanced appearance of the stomach is normal. No pathologic dilatation of small bowel or colon. The appendix is not confidently identified and may be surgically absent. Regardless, there are no inflammatory changes noted adjacent to the cecum to suggest the presence of an acute appendicitis at this time. Vascular/Lymphatic: No atherosclerotic calcifications in the abdominal aorta or pelvic vasculature. No lymphadenopathy noted in the abdomen or pelvis. Reproductive: Prostate gland and seminal vesicles are unremarkable in appearance. Other: No significant volume of ascites.  No pneumoperitoneum. Musculoskeletal: There are no aggressive appearing lytic or blastic lesions noted in the visualized portions of the skeleton. IMPRESSION: 1. 6 mm calculus at the left ureterovesicular junction with mild proximal left hydroureteronephrosis indicative of obstruction at this time. Electronically Signed   By: Vinnie Langton M.D.   On: 06/07/2020 10:04    Procedures Procedures   Medications Ordered in ED  Medications  sodium chloride 0.9 % bolus 1,000 mL (0 mLs Intravenous Stopped 06/07/20 1106)  ondansetron (ZOFRAN) injection 4 mg (4 mg Intravenous Given 06/07/20 0916)  morphine 4 MG/ML injection 4 mg (4 mg Intravenous Given 06/07/20 0916)  ketorolac (TORADOL) 30 MG/ML injection 15 mg (15 mg Intravenous Given 06/07/20 1007)    ED Course  I have reviewed the triage vital signs and the nursing notes.  Pertinent labs & imaging results that were available during my care of the patient were reviewed by me and considered in my medical decision making (see chart for details).  Clinical Course as of 06/07/20 1156  Tue Jun 07, 2020  0924 WBC(!): 13.1 [MV]    Clinical Course User Index [MV] Eustaquio Maize, [place]   MDM Rules/Calculators/A&P                          35 year old male who presents to the ED today complaining of left-sided flank pain for the past day with associated nausea and  vomiting.  He is also having dysuria and decrease in urine.  He has a history of kidney stones however states he feels like this kidney stone is much larger.  On arrival to the ED patient is noted to be slightly bradycardic at 53, history of same.  He appears uncomfortable on exam and is writhing around in pain.  He has left-sided CVA tenderness as well as left lower quadrant abdominal tenderness to palpation.  Will work-up for kidney stone at this time and provide fluids, pain meds, antiemetics.  CBC with a leukocytosis of 13,000.  Patient without fevers here in the ED.  Pending urinalysis at this time with concern for possible infected stone.  BMP with a creatinine 1.38.  Do not have baseline to compare to.  Bladder scan obtained at 138 cc.  Does not appear patient is retaining at this time.  CT renal stone study with a 6 mm stone at the UVJ with mild left hydronephrosis.  Urinalysis obtained without any leukocytes or nitrites.  No bacteria appreciated.  On reevaluation patient resting comfortably after IV morphine and IV Toradol at reduced dose due to kidney function.  Given the stone is 6 mm in nature without infection I do feel he should pass this on its own especially given it is so close to the bladder at this time.  Will discharge home with pain medication, Flomax, antiemetics and have patient follow-up with urology for further evaluation.  We will send him home with strainer as well.  Patient instructed to return to the ED for any worsening symptoms specifically fevers as well as urinary retention.  He is in agreement with plan and stable for discharge.   This note was prepared using Dragon voice recognition software and may include unintentional dictation errors due to the inherent limitations of voice recognition software.   Final Clinical Impression(s) / ED Diagnoses Final diagnoses:  Kidney stone  Hydronephrosis with urinary obstruction due to ureteral calculus    Rx / DC Orders ED  Discharge Orders         Ordered    oxyCODONE-acetaminophen (PERCOCET/ROXICET) 5-325 MG tablet  Every 6 hours PRN        06/07/20 1155    ondansetron (ZOFRAN ODT) 4 MG disintegrating tablet  Every 8 hours PRN        06/07/20 1155    tamsulosin (FLOMAX) 0.4 MG CAPS capsule  Daily  06/07/20 1155           Discharge Instructions     Please pick up medications and take as prescribed.  I have provided you with pain medication to take as needed.  I would recommend taking ibuprofen for pain and then to take the narcotic pain medication for breakthrough.  I have also prescribed nausea medication to take as needed.  Please take the Flomax daily to help with creasing urination to push the kidney stone out.  It is recommended that you increase your oral intake to help with this as well.  Follow-up with [facility] regarding your ED visit today.  You will need to call them to schedule an appointment.  Return to the ED immediately for any worsening symptoms including fevers greater than 100.4 as well as inability to urinate.        Eustaquio Maize, PA-C 06/07/20 1156    Sherwood Gambler, MD 06/08/20 [phone]

## 2020-06-07 NOTE — ED Notes (Signed)
Pt reports hx of high BP and takes medicine, was unable to take this am d/t n/v.

## 2020-09-16 ENCOUNTER — Emergency Department (HOSPITAL_COMMUNITY): Payer: Medicaid Other

## 2020-09-16 ENCOUNTER — Observation Stay (HOSPITAL_COMMUNITY)
Admission: EM | Admit: 2020-09-16 | Discharge: 2020-09-17 | Disposition: A | Discharge status: Home / Self Care | Payer: Medicaid Other | Attending: Family Medicine | Admitting: Family Medicine

## 2020-09-16 ENCOUNTER — Other Ambulatory Visit: Payer: Self-pay

## 2020-09-16 ENCOUNTER — Encounter (HOSPITAL_COMMUNITY): Payer: Self-pay | Admitting: *Deleted

## 2020-09-16 DIAGNOSIS — E66811 Obesity, class 1: Secondary | ICD-10-CM | POA: Diagnosis present

## 2020-09-16 DIAGNOSIS — F17213 Nicotine dependence, cigarettes, with withdrawal: Secondary | ICD-10-CM

## 2020-09-16 DIAGNOSIS — I110 Hypertensive heart disease with heart failure: Secondary | ICD-10-CM | POA: Diagnosis not present

## 2020-09-16 DIAGNOSIS — R072 Precordial pain: Secondary | ICD-10-CM

## 2020-09-16 DIAGNOSIS — R079 Chest pain, unspecified: Secondary | ICD-10-CM | POA: Diagnosis present

## 2020-09-16 DIAGNOSIS — I10 Essential (primary) hypertension: Secondary | ICD-10-CM | POA: Diagnosis present

## 2020-09-16 DIAGNOSIS — I160 Hypertensive urgency: Principal | ICD-10-CM | POA: Diagnosis present

## 2020-09-16 DIAGNOSIS — R0789 Other chest pain: Secondary | ICD-10-CM

## 2020-09-16 DIAGNOSIS — Z20822 Contact with and (suspected) exposure to covid-19: Secondary | ICD-10-CM | POA: Insufficient documentation

## 2020-09-16 DIAGNOSIS — E669 Obesity, unspecified: Secondary | ICD-10-CM | POA: Diagnosis present

## 2020-09-16 DIAGNOSIS — F17210 Nicotine dependence, cigarettes, uncomplicated: Secondary | ICD-10-CM | POA: Insufficient documentation

## 2020-09-16 DIAGNOSIS — Z79899 Other long term (current) drug therapy: Secondary | ICD-10-CM | POA: Diagnosis not present

## 2020-09-16 DIAGNOSIS — I5032 Chronic diastolic (congestive) heart failure: Secondary | ICD-10-CM | POA: Diagnosis present

## 2020-09-16 DIAGNOSIS — E876 Hypokalemia: Secondary | ICD-10-CM | POA: Diagnosis present

## 2020-09-16 DIAGNOSIS — F17200 Nicotine dependence, unspecified, uncomplicated: Secondary | ICD-10-CM | POA: Diagnosis present

## 2020-09-16 LAB — CBC
HCT: 43.6 % (ref 39.0–52.0)
Hemoglobin: 15 g/dL (ref 13.0–17.0)
MCH: 31.3 pg (ref 26.0–34.0)
MCHC: 34.4 g/dL (ref 30.0–36.0)
MCV: 91 fL (ref 80.0–100.0)
Platelets: 255 10*3/uL (ref 150–400)
RBC: 4.79 MIL/uL (ref 4.22–5.81)
RDW: 13.5 % (ref 11.5–15.5)
WBC: 10.6 10*3/uL — ABNORMAL HIGH (ref 4.0–10.5)
nRBC: 0 % (ref 0.0–0.2)

## 2020-09-16 LAB — DIFFERENTIAL
Abs Immature Granulocytes: 0.03 10*3/uL (ref 0.00–0.07)
Basophils Absolute: 0.1 10*3/uL (ref 0.0–0.1)
Basophils Relative: 1 %
Eosinophils Absolute: 0.5 10*3/uL (ref 0.0–0.5)
Eosinophils Relative: 5 %
Immature Granulocytes: 0 %
Lymphocytes Relative: 25 %
Lymphs Abs: 2.7 10*3/uL (ref 0.7–4.0)
Monocytes Absolute: 0.7 10*3/uL (ref 0.1–1.0)
Monocytes Relative: 6 %
Neutro Abs: 6.6 10*3/uL (ref 1.7–7.7)
Neutrophils Relative %: 63 %

## 2020-09-16 LAB — BASIC METABOLIC PANEL WITH GFR
Anion gap: 8 (ref 5–15)
BUN: 19 mg/dL (ref 6–20)
CO2: 23 mmol/L (ref 22–32)
Calcium: 8.6 mg/dL — ABNORMAL LOW (ref 8.9–10.3)
Chloride: 107 mmol/L (ref 98–111)
Creatinine, Ser: 0.95 mg/dL (ref 0.61–1.24)
GFR, Estimated: >60 mL/min
Glucose, Bld: 87 mg/dL (ref 70–99)
Potassium: 3.4 mmol/L — ABNORMAL LOW (ref 3.5–5.1)
Sodium: 138 mmol/L (ref 135–145)

## 2020-09-16 LAB — HEPATIC FUNCTION PANEL
ALT: 23 U/L (ref 0–44)
AST: 22 U/L (ref 15–41)
Albumin: 4.4 g/dL (ref 3.5–5.0)
Alkaline Phosphatase: 57 U/L (ref 38–126)
Bilirubin, Direct: 0.1 mg/dL (ref 0.0–0.2)
Indirect Bilirubin: 0.6 mg/dL (ref 0.3–0.9)
Total Bilirubin: 0.7 mg/dL (ref 0.3–1.2)
Total Protein: 7.5 g/dL (ref 6.5–8.1)

## 2020-09-16 LAB — TROPONIN I (HIGH SENSITIVITY)
Troponin I (High Sensitivity): 6 ng/L (ref <18)
Troponin I (High Sensitivity): 5 ng/L

## 2020-09-16 LAB — RAPID URINE DRUG SCREEN, HOSP PERFORMED
Amphetamines: NOT DETECTED
Barbiturates: NOT DETECTED
Benzodiazepines: NOT DETECTED
Cocaine: NOT DETECTED
Opiates: NOT DETECTED
Tetrahydrocannabinol: POSITIVE — AB

## 2020-09-16 LAB — BRAIN NATRIURETIC PEPTIDE: B Natriuretic Peptide: 36 pg/mL (ref 0.0–100.0)

## 2020-09-16 LAB — HIV ANTIBODY (ROUTINE TESTING W REFLEX): HIV Screen 4th Generation wRfx: NONREACTIVE

## 2020-09-16 LAB — TSH: TSH: 1.074 u[IU]/mL (ref 0.350–4.500)

## 2020-09-16 MED ORDER — LABETALOL HCL 5 MG/ML IV SOLN
20.0000 mg | Freq: Once | INTRAVENOUS | Status: AC
  Administered 2020-09-16: 20 mg via INTRAVENOUS
  Filled 2020-09-16: qty 4

## 2020-09-16 MED ORDER — HYDRALAZINE HCL 20 MG/ML IJ SOLN
5.0000 mg | Freq: Once | INTRAMUSCULAR | Status: AC
  Administered 2020-09-16: 5 mg via INTRAVENOUS
  Filled 2020-09-16: qty 1

## 2020-09-16 MED ORDER — LISINOPRIL 10 MG PO TABS: 20.0000 mg | ORAL_TABLET | Freq: Every day | ORAL | Status: DC

## 2020-09-16 MED ORDER — SODIUM CHLORIDE 0.9% FLUSH: 3.0000 mL | INTRAVENOUS | Status: DC | PRN

## 2020-09-16 MED ORDER — ACETAMINOPHEN 325 MG PO TABS
650.0000 mg | ORAL_TABLET | Freq: Four times a day (QID) | ORAL | Status: DC | PRN
  Administered 2020-09-16 – 2020-09-17 (×2): 650 mg via ORAL
  Filled 2020-09-16 (×2): qty 2

## 2020-09-16 MED ORDER — CHLORTHALIDONE 25 MG PO TABS
25.0000 mg | ORAL_TABLET | Freq: Every day | ORAL | Status: DC
  Administered 2020-09-17: 25 mg via ORAL
  Filled 2020-09-16 (×3): qty 1

## 2020-09-16 MED ORDER — ONDANSETRON HCL 4 MG PO TABS: 4.0000 mg | ORAL_TABLET | Freq: Four times a day (QID) | ORAL | Status: DC | PRN

## 2020-09-16 MED ORDER — HYDRALAZINE HCL 20 MG/ML IJ SOLN
10.0000 mg | Freq: Four times a day (QID) | INTRAMUSCULAR | Status: DC | PRN
  Administered 2020-09-16: 10 mg via INTRAVENOUS
  Filled 2020-09-16: qty 1

## 2020-09-16 MED ORDER — LISINOPRIL-HYDROCHLOROTHIAZIDE 20-12.5 MG PO TABS: 1.0000 | ORAL_TABLET | Freq: Every day | ORAL | Status: DC

## 2020-09-16 MED ORDER — NICOTINE 14 MG/24HR TD PT24
14.0000 mg | MEDICATED_PATCH | Freq: Every day | TRANSDERMAL | Status: DC
  Administered 2020-09-17: 14 mg via TRANSDERMAL
  Filled 2020-09-16: qty 1

## 2020-09-16 MED ORDER — AMLODIPINE BESYLATE 5 MG PO TABS: 10.0000 mg | ORAL_TABLET | Freq: Every day | ORAL | Status: DC

## 2020-09-16 MED ORDER — ENOXAPARIN SODIUM 40 MG/0.4ML IJ SOSY
40.0000 mg | PREFILLED_SYRINGE | INTRAMUSCULAR | Status: DC
  Filled 2020-09-16: qty 0.4

## 2020-09-16 MED ORDER — NITROGLYCERIN 0.4 MG SL SUBL
0.4000 mg | SUBLINGUAL_TABLET | Freq: Once | SUBLINGUAL | Status: AC
  Administered 2020-09-16: 0.4 mg via SUBLINGUAL
  Filled 2020-09-16: qty 1

## 2020-09-16 MED ORDER — SODIUM CHLORIDE 0.9% FLUSH
3.0000 mL | Freq: Two times a day (BID) | INTRAVENOUS | Status: DC
  Administered 2020-09-16: 3 mL via INTRAVENOUS

## 2020-09-16 MED ORDER — ACETAMINOPHEN 650 MG RE SUPP: 650.0000 mg | Freq: Four times a day (QID) | RECTAL | Status: DC | PRN

## 2020-09-16 MED ORDER — ONDANSETRON HCL 4 MG/2ML IJ SOLN: 4.0000 mg | Freq: Four times a day (QID) | INTRAMUSCULAR | Status: DC | PRN

## 2020-09-16 MED ORDER — POTASSIUM CHLORIDE CRYS ER 20 MEQ PO TBCR
40.0000 meq | EXTENDED_RELEASE_TABLET | Freq: Once | ORAL | Status: AC
  Administered 2020-09-17: 40 meq via ORAL
  Filled 2020-09-16: qty 2

## 2020-09-16 MED ORDER — SODIUM CHLORIDE 0.9 % IV SOLN: 250.0000 mL | INTRAVENOUS | Status: DC | PRN

## 2020-09-16 NOTE — ED Provider Notes (Signed)
[facility] EMERGENCY DEPARTMENT Provider Note   CSN: [id number] Arrival date & time: 09/16/20  1257     History Chief Complaint  Patient presents with   Chest Pain    Mark Glenn is a 35 y.o. male.  Patient states that he has been having chest pain off and on for 4 days.  He also has poorly controlled blood pressure for over 10 years.  The history is provided by the patient and medical records. No language interpreter was used.  Chest Pain Pain location:  L chest Pain quality: aching   Pain radiates to:  Does not radiate Pain severity:  Moderate Onset quality:  Sudden Timing:  Constant Progression:  Waxing and waning Chronicity:  New Context: not breathing   Associated symptoms: no abdominal pain, no back pain, no cough, no fatigue and no headache       Past Medical History:  Diagnosis Date   Hypertension    Kidney calculus     Patient Active Problem List   Diagnosis Date Noted   Hypertensive urgency 09/16/2020    Past Surgical History:  Procedure Laterality Date   ANKLE SURGERY     right   MOUTH SURGERY         No family history on file.  Social History   Tobacco Use   Smoking status: Every Day    Packs/day: 0.50    Types: Cigarettes   Smokeless tobacco: Never  Vaping Use   Vaping Use: Never used  Substance Use Topics   Alcohol use: Yes   Drug use: Never    Home Medications Prior to Admission medications   Medication Sig Start Date End Date Taking? Authorizing Provider  ibuprofen (ADVIL) 200 MG tablet Take 200 mg by mouth every 6 (six) hours as needed.    [provider]  lisinopril-hydrochlorothiazide (ZESTORETIC) 20-12.5 MG tablet Take 1 tablet by mouth daily. 11/19/19   [provider]  ondansetron (ZOFRAN ODT) 4 MG disintegrating tablet Take 1 tablet (4 mg total) by mouth every 8 (eight) hours as needed for nausea or vomiting. 06/07/20   Eustaquio Maize, PA-C  oxyCODONE-acetaminophen (PERCOCET/ROXICET) 5-325 MG tablet  Take 1 tablet by mouth every 6 (six) hours as needed for severe pain. 06/07/20   Eustaquio Maize, PA-C    Allergies    Patient has no known allergies.  Review of Systems   Review of Systems  Constitutional:  Negative for appetite change and fatigue.  HENT:  Negative for congestion, ear discharge and sinus pressure.   Eyes:  Negative for discharge.  Respiratory:  Negative for cough.   Cardiovascular:  Positive for chest pain.  Gastrointestinal:  Negative for abdominal pain and diarrhea.  Genitourinary:  Negative for frequency and hematuria.  Musculoskeletal:  Negative for back pain.  Skin:  Negative for rash.  Neurological:  Negative for seizures and headaches.  Psychiatric/Behavioral:  Negative for hallucinations.    Physical Exam Updated Vital Signs BP (!) 179/119   Pulse (!) 56   Resp (!) 28   SpO2 98%   Physical Exam Constitutional:      Appearance: He is well-developed.  HENT:     Head: Normocephalic.     Nose: Nose normal.  Eyes:     General: No scleral icterus.    Conjunctiva/sclera: Conjunctivae normal.  Neck:     Thyroid: No thyromegaly.  Cardiovascular:     Rate and Rhythm: Normal rate and regular rhythm.     Heart sounds: No murmur heard.  No friction rub. No gallop.  Pulmonary:     Breath sounds: No stridor. No wheezing or rales.  Chest:     Chest wall: No tenderness.  Abdominal:     General: There is no distension.     Tenderness: There is no abdominal tenderness. There is no rebound.  Musculoskeletal:        General: Normal range of motion.     Cervical back: Neck supple.  Lymphadenopathy:     Cervical: No cervical adenopathy.  Skin:    Findings: No erythema or rash.  Neurological:     Mental Status: He is oriented to person, place, and time.     Motor: No abnormal muscle tone.     Coordination: Coordination normal.  Psychiatric:        Behavior: Behavior normal.    ED Results / Procedures / Treatments   Labs (all labs ordered are listed,  but only abnormal results are displayed) Labs Reviewed  BASIC METABOLIC PANEL - Abnormal; Notable for the following components:      Result Value   Potassium 3.4 (*)    Calcium 8.6 (*)    All other components within normal limits  CBC - Abnormal; Notable for the following components:   WBC 10.6 (*)    All other components within normal limits  HEPATIC FUNCTION PANEL  BRAIN NATRIURETIC PEPTIDE  DIFFERENTIAL  ALDOSTERONE + RENIN ACTIVITY W/ RATIO  TSH  RAPID URINE DRUG SCREEN, HOSP PERFORMED  TROPONIN I (HIGH SENSITIVITY)  TROPONIN I (HIGH SENSITIVITY)    EKG EKG Interpretation  Date/Time:  Friday September 16 2020 13:19:28 EDT Ventricular Rate:  66 PR Interval:  150 QRS Duration: 94 QT Interval:  430 QTC Calculation: 450 R Axis:   40 Text Interpretation: Normal sinus rhythm Normal ECG Confirmed by Milton Ferguson [phone]) on 09/16/2020 1:36:51 PM  Radiology DG Chest Port 1 View  Result Date: 09/16/2020 CLINICAL DATA:  Chest pain and shortness of breath for a few days. EXAM: PORTABLE CHEST 1 VIEW COMPARISON:  Chest radiographs and CTA 04/13/2020 FINDINGS: The cardiomediastinal silhouette is within normal limits. The lungs are well inflated and clear. There is no evidence of pleural effusion or pneumothorax. No acute osseous abnormality is identified. IMPRESSION: No active disease. Electronically Signed   By: Logan Bores M.D.   On: 09/16/2020 14:28    Procedures Procedures   Medications Ordered in ED Medications  amLODipine (NORVASC) tablet 10 mg (has no administration in time range)  nitroGLYCERIN (NITROSTAT) SL tablet 0.4 mg (0.4 mg Sublingual Given 09/16/20 1407)  labetalol (NORMODYNE) injection 20 mg (20 mg Intravenous Given 09/16/20 1408)  hydrALAZINE (APRESOLINE) injection 5 mg (5 mg Intravenous Given 09/16/20 1455)    ED Course  I have reviewed the triage vital signs and the nursing notes.  Pertinent labs & imaging results that were available during my care of the patient  were reviewed by me and considered in my medical decision making (see chart for details). I discussed the patient with Dr. Harl Bowie cardiology and he will come see the patient but feels like he needs to be admitted to get his blood pressure under control  CRITICAL CARE Performed by: Milton Ferguson Total critical care time: 45 minutes Critical care time was exclusive of separately billable procedures and treating other patients. Critical care was necessary to treat or prevent imminent or life-threatening deterioration. Critical care was time spent personally by me on the following activities: development of treatment plan with patient and/or surrogate as well  as nursing, discussions with consultants, evaluation of patient's response to treatment, examination of patient, obtaining history from patient or surrogate, ordering and performing treatments and interventions, ordering and review of laboratory studies, ordering and review of radiographic studies, pulse oximetry and re-evaluation of patient's condition.  MDM Rules/Calculators/A&P                           Patient will be admitted for chest pain and hypertensive urgency. Final Clinical Impression(s) / ED Diagnoses Final diagnoses:  Atypical chest pain  Hypertensive urgency    Rx / DC Orders ED Discharge Orders     None        Milton Ferguson, MD 09/19/20 1051

## 2020-09-16 NOTE — Consult Note (Signed)
Cardiology Consultation:   Patient ID: Mark Glenn MRN: [id number]; DOB: 1985-05-23  Admit date: 09/16/2020 Date of Consult: 09/16/2020  PCP:  [facility] [place] [facility] Cardiologist:  New   Patient Profile:   Mark Glenn is a 35 y.o. male with a hx of severe HTN, family history of heart disease, tobacco use  who is being seen 09/16/2020 for the evaluation of chest pain and HTN  at the request of Dr Roderic Palau.  History of Present Illness:   Mark Glenn 35 yo male with history of severe HTN and tobacco use presents with severely elevated bp and chest pain. In ER initial bp 200/125. Similar presentation in 03/2020, he left AMA. At that time CTA was negative for acute aortic pathology.   Presents with chest pain. Symptoms ongoing x 3 days. Started as mild midchest pressure constant for 2 days. Yesterday while at work progression of pain to 7/10 in severity, some associated SOB and diaphoresis. In ER SBP in the 200s. As bp had trended down symptoms are improving. He does report regular NSAID use, denies any drug or EtOH use. Reports compliance with his home bp meds. Reports father had MIs in his 20s   ER vitals p 61 bp 199/121 96% K 3.4 Cr 0.95 BUN 19 WBC 10.6 Hgb 15 plt 255 BNP 36 UDS pending Trop 6--> CXR no acute process EKG SR, chronic nonspecilfic anterior ST/T chagnes   03/2020 CTA C/A/P: no acute aortic pathology, normal renal arteries.     03/2020 CTA C/A/P: no acute aortic pathology, normal renal arteries  Past Medical History:  Diagnosis Date   Hypertension    Kidney calculus     Past Surgical History:  Procedure Laterality Date   ANKLE SURGERY     right   MOUTH SURGERY        Inpatient Medications: Scheduled Meds:  Continuous Infusions:  PRN Meds:   Allergies:   No Known Allergies  Social History:   Social History   Socioeconomic History   Marital status: Married    Spouse name: Not on file   Number of children: Not on  file   Years of education: Not on file   Highest education level: Not on file  Occupational History   Not on file  Tobacco Use   Smoking status: Every Day    Packs/day: 0.50    Types: Cigarettes   Smokeless tobacco: Never  Vaping Use   Vaping Use: Never used  Substance and Sexual Activity   Alcohol use: Yes   Drug use: Never   Sexual activity: Not on file  Other Topics Concern   Not on file  Social History Narrative   Not on file   Social Determinants of Health   Financial Resource Strain: Not on file  Food Insecurity: Not on file  Transportation Needs: Not on file  Physical Activity: Not on file  Stress: Not on file  Social Connections: Not on file  Intimate Partner Violence: Not on file    Family History:   Father MIs in his 89s, died of heart failure in his mid 60s  ROS:  Please see the history of present illness.  All other ROS reviewed and negative.     Physical Exam/Data:   Vitals:   09/16/20 1330 09/16/20 1400 09/16/20 1430 09/16/20 1500  BP: (!) 199/121 (!) 200/125 (!) 179/119 (!) 175/105  Pulse: 61 61 (!) 56 (!) 52  Resp: (!) 21 (!) 29 (!) 28 (!) 27  SpO2: 96% 99% 98% 99%   No intake or output data in the 24 hours ending 09/16/20 1641 Last 3 Weights 06/07/2020 04/13/2020 03/13/2020  Weight (lbs) 220 lb 230 lb 246 lb 14.4 oz  Weight (kg) 99.791 kg 104.327 kg 111.993 kg     There is no height or weight on file to calculate BMI.  General:  Well nourished, well developed, in no acute distress HEENT: normal Lymph: no adenopathy Neck: no JVD Endocrine:  No thryomegaly Vascular: No carotid bruits; FA pulses 2+ bilaterally without bruits  Cardiac:  normal S1, S2; RRR; no murmur  Lungs:  clear to auscultation bilaterally, no wheezing, rhonchi or rales  Abd: soft, nontender, no hepatomegaly  Ext: no edema Musculoskeletal:  No deformities, BUE and BLE strength normal and equal Skin: warm and dry  Neuro:  CNs 2-12 intact, no focal abnormalities noted Psych:   Normal affect     Laboratory Data:  High Sensitivity Troponin:   Recent Labs  Lab 09/16/20 1337  TROPONINIHS 6     Chemistry Recent Labs  Lab 09/16/20 1337  NA 138  K 3.4*  CL 107  CO2 23  GLUCOSE 87  BUN 19  CREATININE 0.95  CALCIUM 8.6*  GFRNONAA >60  ANIONGAP 8    Recent Labs  Lab 09/16/20 1345  PROT 7.5  ALBUMIN 4.4  AST 22  ALT 23  ALKPHOS 57  BILITOT 0.7   Hematology Recent Labs  Lab 09/16/20 1337  WBC 10.6*  RBC 4.79  HGB 15.0  HCT 43.6  MCV 91.0  MCH 31.3  MCHC 34.4  RDW 13.5  PLT 255   BNP Recent Labs  Lab 09/16/20 1348  BNP 36.0    DDimer No results for input(s): DDIMER in the last 168 hours.   Radiology/Studies:  DG Chest Port 1 View  Result Date: 09/16/2020 CLINICAL DATA:  Chest pain and shortness of breath for a few days. EXAM: PORTABLE CHEST 1 VIEW COMPARISON:  Chest radiographs and CTA 04/13/2020 FINDINGS: The cardiomediastinal silhouette is within normal limits. The lungs are well inflated and clear. There is no evidence of pleural effusion or pneumothorax. No acute osseous abnormality is identified. IMPRESSION: No active disease. Electronically Signed   By: Logan Bores M.D.   On: 09/16/2020 14:28     Assessment and Plan:   1.Severe HTN/Hypertensive urgency - on presentation bps 200s/10ss - marked HTN given his age raised concern for secondary HTN - 03/2020 CTA normal renal arteries. Renin/aldo levels pending. Could consider outpatient sleep study - he reports medication compliance. Does have regular high NSAID use, no EtOH or drugs - in ER received labatelol 6m IV x 1, hydralazine 563m Written for norvasc 1022mral.   - would manage with norvasc 71m34mhlorthalidone 25mg55msinopril 20mg 56my initially. Room to titrate lisinopril, if additional agent needed would add aldactone 12.5-25mg daily.     2. Chest pain - in setting of severe HTN. Chest pressure constant lasting hours to days at a time, not consistent with  ACS. Pain improving in ER with bp control.  - similar presentation in 03/2020, at that time CTA was negative - trops negative and EKG without specific ischemic changes. F/u echo, at this time do not think ACS. Follow symptoms with bp control - UDS pending - do not plan on ishcemic testing at this time. If significant recurrent symptoms with blood pressure control could reconsider.       For questions or updates, please contact [facility] consult  [email or web address] for contact info under    Signed, Carlyle Dolly, MD  09/16/2020 4:41 PM

## 2020-09-16 NOTE — H&P (Signed)
History and Physical    Mark Glenn PYK:[id number] DOB: November 30, 1985 DOA: 09/16/2020  PCP: [facility] [place]   Patient coming from: Home  I have personally briefly reviewed patient's old medical records in [place]  Chief Complaint: Chest pain  HPI: Mark Glenn is a 35 y.o. male with medical history significant for hypertension and nicotine dependence who presents to the ER for evaluation of chest pain which he has had intermittently for 4 days but worse on the night prior to his admission. Patient states that he was at work, works as a Training and development officer at [employer] when he developed chest pain which was midsternal and described as someone squeezing his chest.  He rated his pain a 5 x 10 in intensity at its worst.  Pain was nonradiating and he denies any associated nausea, no vomiting, no palpitations or diaphoresis. He denies having any cough, no fever, no chills, no dizziness, no lightheadedness, no lower extremity swelling, no abdominal pain, no urinary symptoms, no headache, no changes in his mental status, no focal deficits or blurred vision. He has a family history of congestive heart failure and father died in his 64s from heart disease. Labs show sodium 138, potassium 3.4, chloride 107, bicarb 23, glucose 87, BUN 19, creatinine 0.95, calcium 8.6, alkaline phosphatase 57, albumin 4.4, AST 22, ALT 23, total protein 7.5, BNP 36, white count 10.6, hemoglobin 15.0, hematocrit 43.6, MCV 91.0, RDW 13.5, platelet count 255 Chest x-ray reviewed by me shows no evidence of acute cardiopulmonary disease Twelve-lead EKG reviewed by me shows normal EKG with no acute ST or T wave changes.   ED Course: Patient is a 35 year old Caucasian male with a history of hypertension and nicotine dependence who presents to the ER for evaluation of chest pain and found to have significantly elevated blood pressure. He received a dose of labetalol 20 mg IV and hydralazine 5 mg with systolic blood pressure  still in the 170s. He will be referred to observation status for further evaluation.     Review of Systems: As per HPI otherwise all other systems reviewed and negative.    Past Medical History:  Diagnosis Date   Hypertension    Kidney calculus     Past Surgical History:  Procedure Laterality Date   ANKLE SURGERY     right   MOUTH SURGERY       reports that he has been smoking cigarettes. He has been smoking an average of .5 packs per day. He has never used smokeless tobacco. He reports current alcohol use. He reports that he does not use drugs.  No Known Allergies  Family History  Problem Relation Age of Onset   Heart failure Father       Prior to Admission medications   Medication Sig Start Date End Date Taking? Authorizing Provider  ibuprofen (ADVIL) 200 MG tablet Take 200 mg by mouth every 6 (six) hours as needed.    [provider]  lisinopril-hydrochlorothiazide (ZESTORETIC) 20-12.5 MG tablet Take 1 tablet by mouth daily. 11/19/19   [provider]  ondansetron (ZOFRAN ODT) 4 MG disintegrating tablet Take 1 tablet (4 mg total) by mouth every 8 (eight) hours as needed for nausea or vomiting. 06/07/20   Eustaquio Maize, PA-C  oxyCODONE-acetaminophen (PERCOCET/ROXICET) 5-325 MG tablet Take 1 tablet by mouth every 6 (six) hours as needed for severe pain. 06/07/20   Eustaquio Maize, PA-C    Physical Exam: Vitals:   09/16/20 1330 09/16/20 1400 09/16/20 1430 09/16/20 1500  BP: (!) 199/121 (!) 200/125 (!) 179/119 (!) 175/105  Pulse: 61 61 (!) 56 (!) 52  Resp: (!) 21 (!) 29 (!) 28 (!) 27  SpO2: 96% 99% 98% 99%     Vitals:   09/16/20 1330 09/16/20 1400 09/16/20 1430 09/16/20 1500  BP: (!) 199/121 (!) 200/125 (!) 179/119 (!) 175/105  Pulse: 61 61 (!) 56 (!) 52  Resp: (!) 21 (!) 29 (!) 28 (!) 27  SpO2: 96% 99% 98% 99%      Constitutional: Alert and oriented x 3 . Not in any apparent distress HEENT:      Head: Normocephalic and atraumatic.          Eyes: PERLA, EOMI, Conjunctivae are normal. Sclera is non-icteric.       Mouth/Throat: Mucous membranes are moist.       Neck: Supple with no signs of meningismus. Cardiovascular: Bradycardia. No murmurs, gallops, or rubs. 2+ symmetrical distal pulses are present . No JVD. No LE edema Respiratory: Respiratory effort normal .Lungs sounds clear bilaterally. No wheezes, crackles, or rhonchi.  Gastrointestinal: Soft, non tender, and non distended with positive bowel sounds.  Genitourinary: No CVA tenderness. Musculoskeletal: Nontender with normal range of motion in all extremities. No cyanosis, or erythema of extremities. Neurologic:  Face is symmetric. Moving all extremities. No gross focal neurologic deficits . Skin: Skin is warm, dry.  No rash or ulcers Psychiatric: Mood and affect are normal    Labs on Admission: I have personally reviewed following labs and imaging studies  CBC: Recent Labs  Lab 09/16/20 1337  WBC 10.6*  NEUTROABS 6.6  HGB 15.0  HCT 43.6  MCV 91.0  PLT 671   Basic Metabolic Panel: Recent Labs  Lab 09/16/20 1337  NA 138  K 3.4*  CL 107  CO2 23  GLUCOSE 87  BUN 19  CREATININE 0.95  CALCIUM 8.6*   GFR: CrCl cannot be calculated (Unknown ideal weight.). Liver Function Tests: Recent Labs  Lab 09/16/20 1345  AST 22  ALT 23  ALKPHOS 57  BILITOT 0.7  PROT 7.5  ALBUMIN 4.4   No results for input(s): LIPASE, AMYLASE in the last 168 hours. No results for input(s): AMMONIA in the last 168 hours. Coagulation Profile: No results for input(s): INR, PROTIME in the last 168 hours. Cardiac Enzymes: No results for input(s): CKTOTAL, CKMB, CKMBINDEX, TROPONINI in the last 168 hours. BNP (last 3 results) No results for input(s): PROBNP in the last 8760 hours. HbA1C: No results for input(s): HGBA1C in the last 72 hours. CBG: No results for input(s): GLUCAP in the last 168 hours. Lipid Profile: No results for input(s): CHOL, HDL, LDLCALC, TRIG, CHOLHDL,  LDLDIRECT in the last 72 hours. Thyroid Function Tests: No results for input(s): TSH, T4TOTAL, FREET4, T3FREE, THYROIDAB in the last 72 hours. Anemia Panel: No results for input(s): VITAMINB12, FOLATE, FERRITIN, TIBC, IRON, RETICCTPCT in the last 72 hours. Urine analysis:    Component Value Date/Time   COLORURINE YELLOW 06/07/2020 1114   APPEARANCEUR HAZY (A) 06/07/2020 1114   LABSPEC 1.011 06/07/2020 1114   PHURINE 9.0 (H) 06/07/2020 1114   GLUCOSEU NEGATIVE 06/07/2020 1114   HGBUR NEGATIVE 06/07/2020 1114   BILIRUBINUR NEGATIVE 06/07/2020 1114   [place] 06/07/2020 1114   PROTEINUR NEGATIVE 06/07/2020 1114   NITRITE NEGATIVE 06/07/2020 1114   LEUKOCYTESUR NEGATIVE 06/07/2020 1114    Radiological Exams on Admission: DG Chest Port 1 View  Result Date: 09/16/2020 CLINICAL DATA:  Chest pain and shortness of breath for  a few days. EXAM: PORTABLE CHEST 1 VIEW COMPARISON:  Chest radiographs and CTA 04/13/2020 FINDINGS: The cardiomediastinal silhouette is within normal limits. The lungs are well inflated and clear. There is no evidence of pleural effusion or pneumothorax. No acute osseous abnormality is identified. IMPRESSION: No active disease. Electronically Signed   By: Logan Bores M.D.   On: 09/16/2020 14:28     Assessment/Plan Principal Problem:   Hypertensive urgency Active Problems:   Nicotine dependence   Hypokalemia     Hypertensive urgency Patient has a known history of hypertension and presents for evaluation of chest pain. Noted to have poorly controlled hypertension Patient received a dose of lisinopril/HCTZ as well as labetalol 20 mg IV and hydralazine 5 mg IV We will add amlodipine 10 mg daily Place patient on hydralazine 10 mg IV every 6 for systolic blood pressure greater than 155mHg    Chest pain Atypical and most likely secondary to hypertensive urgency Patient has a normal twelve-lead EKG and initial troponin is negative Follow-up results of  2D echocardiogram    Hypokalemia Secondary to diuretic use Supplement potassium    Nicotine dependence Smoking cessation has been discussed with patient in detail We will place patient on a nicotine transdermal patch 14 mg daily  DVT prophylaxis: Lovenox  Code Status: full code  Family Communication: Greater than 50% of time was spent discussing patient's condition and plan of care with him at the bedside.  All questions and concerns have been addressed.  He verbalizes understanding and agrees with the plan. Disposition Plan: Back to previous home environment Consults called: Cardiology Status: Observation    Thijs Brunton MD [facility]     09/16/2020, 4:30 PM

## 2020-09-16 NOTE — ED Triage Notes (Signed)
Chest pain x 4 days, worse last night

## 2020-09-17 ENCOUNTER — Observation Stay (HOSPITAL_BASED_OUTPATIENT_CLINIC_OR_DEPARTMENT_OTHER): Payer: Medicaid Other

## 2020-09-17 DIAGNOSIS — E669 Obesity, unspecified: Secondary | ICD-10-CM | POA: Diagnosis not present

## 2020-09-17 DIAGNOSIS — R079 Chest pain, unspecified: Secondary | ICD-10-CM | POA: Diagnosis not present

## 2020-09-17 DIAGNOSIS — I5032 Chronic diastolic (congestive) heart failure: Secondary | ICD-10-CM | POA: Diagnosis present

## 2020-09-17 DIAGNOSIS — I10 Essential (primary) hypertension: Secondary | ICD-10-CM | POA: Diagnosis present

## 2020-09-17 LAB — BASIC METABOLIC PANEL
Anion gap: 7 (ref 5–15)
BUN: 14 mg/dL (ref 6–20)
CO2: 23 mmol/L (ref 22–32)
Calcium: 8.5 mg/dL — ABNORMAL LOW (ref 8.9–10.3)
Chloride: 106 mmol/L (ref 98–111)
Creatinine, Ser: 0.8 mg/dL (ref 0.61–1.24)
GFR, Estimated: >60 mL/min (ref >60)
Glucose, Bld: 112 mg/dL — ABNORMAL HIGH (ref 70–99)
Potassium: 3.2 mmol/L — ABNORMAL LOW (ref 3.5–5.1)
Sodium: 136 mmol/L (ref 135–145)

## 2020-09-17 LAB — ECHOCARDIOGRAM COMPLETE
Area-P 1/2: 2.34 cm2
Height: 70 in
S' Lateral: 3.86 cm
Weight: 3520.31 oz

## 2020-09-17 LAB — MRSA NEXT GEN BY PCR, NASAL: MRSA by PCR Next Gen: NOT DETECTED

## 2020-09-17 LAB — SARS CORONAVIRUS 2 (TAT 6-24 HRS): SARS Coronavirus 2: NEGATIVE

## 2020-09-17 LAB — CBC
HCT: 43.4 % (ref 39.0–52.0)
Hemoglobin: 15 g/dL (ref 13.0–17.0)
MCH: 30.7 pg (ref 26.0–34.0)
MCHC: 34.6 g/dL (ref 30.0–36.0)
MCV: 88.8 fL (ref 80.0–100.0)
Platelets: 261 10*3/uL (ref 150–400)
RBC: 4.89 MIL/uL (ref 4.22–5.81)
RDW: 13.3 % (ref 11.5–15.5)
WBC: 10.5 10*3/uL (ref 4.0–10.5)
nRBC: 0 % (ref 0.0–0.2)

## 2020-09-17 MED ORDER — POTASSIUM CHLORIDE ER 10 MEQ PO TBCR: 10.0000 meq | EXTENDED_RELEASE_TABLET | Freq: Every day | ORAL | 2 refills | Status: DC

## 2020-09-17 MED ORDER — ASPIRIN EC 81 MG PO TBEC: 81.0000 mg | DELAYED_RELEASE_TABLET | Freq: Every day | ORAL | 11 refills | Status: DC

## 2020-09-17 MED ORDER — ACETAMINOPHEN 325 MG PO TABS: 650.0000 mg | ORAL_TABLET | Freq: Four times a day (QID) | ORAL | 3 refills | Status: AC | PRN

## 2020-09-17 MED ORDER — AMLODIPINE BESYLATE 5 MG PO TABS
10.0000 mg | ORAL_TABLET | Freq: Every day | ORAL | Status: DC
  Administered 2020-09-17: 10 mg via ORAL
  Filled 2020-09-17: qty 2

## 2020-09-17 MED ORDER — NICOTINE 21 MG/24HR TD PT24: 21.0000 mg | MEDICATED_PATCH | Freq: Once | TRANSDERMAL | 0 refills | Status: AC

## 2020-09-17 MED ORDER — LISINOPRIL-HYDROCHLOROTHIAZIDE 20-12.5 MG PO TABS: 1.0000 | ORAL_TABLET | Freq: Every day | ORAL | 5 refills | Status: AC

## 2020-09-17 MED ORDER — HYDRALAZINE HCL 25 MG PO TABS: 50.0000 mg | ORAL_TABLET | Freq: Two times a day (BID) | ORAL | Status: DC

## 2020-09-17 MED ORDER — HYDRALAZINE HCL 50 MG PO TABS: 50.0000 mg | ORAL_TABLET | Freq: Two times a day (BID) | ORAL | 5 refills | Status: AC

## 2020-09-17 MED ORDER — HYDRALAZINE HCL 25 MG PO TABS
50.0000 mg | ORAL_TABLET | Freq: Once | ORAL | Status: AC
  Administered 2020-09-17: 50 mg via ORAL
  Filled 2020-09-17: qty 2

## 2020-09-17 MED ORDER — CHLORHEXIDINE GLUCONATE CLOTH 2 % EX PADS
6.0000 | MEDICATED_PAD | Freq: Every day | CUTANEOUS | Status: DC
  Administered 2020-09-17: 6 via TOPICAL

## 2020-09-17 MED ORDER — NICOTINE 21 MG/24HR TD PT24
21.0000 mg | MEDICATED_PATCH | Freq: Once | TRANSDERMAL | Status: DC
  Administered 2020-09-17: 21 mg via TRANSDERMAL
  Filled 2020-09-17: qty 1

## 2020-09-17 MED ORDER — AMLODIPINE BESYLATE 10 MG PO TABS: 10.0000 mg | ORAL_TABLET | Freq: Every day | ORAL | 5 refills | Status: DC

## 2020-09-17 MED ORDER — LISINOPRIL 10 MG PO TABS
40.0000 mg | ORAL_TABLET | Freq: Every day | ORAL | Status: DC
  Administered 2020-09-17: 40 mg via ORAL
  Filled 2020-09-17: qty 4

## 2020-09-17 MED ORDER — NICARDIPINE HCL IN NACL 20-0.86 MG/200ML-% IV SOLN
3.0000 mg/h | INTRAVENOUS | Status: DC
  Administered 2020-09-17: 7 mg/h via INTRAVENOUS
  Administered 2020-09-17 (×2): 6 mg/h via INTRAVENOUS
  Administered 2020-09-17 (×2): 10 mg/h via INTRAVENOUS
  Administered 2020-09-17: 5 mg/h via INTRAVENOUS
  Administered 2020-09-17 (×2): 10 mg/h via INTRAVENOUS
  Filled 2020-09-17: qty 400
  Filled 2020-09-17 (×6): qty 200

## 2020-09-17 MED ORDER — COVID-19 MRNA VACC (MODERNA) 50 MCG/0.25ML IM SUSP
0.2500 mL | Freq: Once | INTRAMUSCULAR | Status: DC
  Filled 2020-09-17: qty 0.25

## 2020-09-17 MED ORDER — COVID-19 MRNA VACC (MODERNA) 50 MCG/0.25ML IM SUSP
0.2500 mL | Freq: Once | INTRAMUSCULAR | Status: AC
  Administered 2020-09-17: 0.25 mL via INTRAMUSCULAR
  Filled 2020-09-17: qty 0.25

## 2020-09-17 NOTE — Progress Notes (Signed)
RN called due to persistent difficulty in being able to control patient's BP despite receiving IV labetalol and hydralazine.  He was admitted earlier due to hypertensive urgency.  Last BP with Dinamap was 197/108 and BP 1 hour later manually was 198/118 as reported by RN.  Patient will be transitioned to IV nicardipine drip and will be transferred to stepdown.  Please hold home p.o. meds at this time until patient is officially transitioned to oral meds.

## 2020-09-17 NOTE — Discharge Instructions (Signed)
1) you are strongly advised to quit smoking--- okay to use over-the-counter nicotine patch to help you quit smoking 2) please take your blood pressure prescriptions as prescribed 3) very low-salt diet advised 4)follow up with [facility] --the primary care provider for blood pressure recheck within 4 to 6 days

## 2020-09-17 NOTE — Progress Notes (Signed)
  Echocardiogram 2D Echocardiogram has been performed.  Martese Vanatta G Karrin Eisenmenger 09/17/2020, 1:52 PM

## 2020-09-17 NOTE — Discharge Summary (Signed)
Mark Glenn, is a 35 y.o. male  DOB 08/06/1985  MRN [id number].  Admission date:  09/16/2020  Admitting Physician  Collier Bullock, MD  Discharge Date:  09/17/2020   Primary MD  Health, [facility]  Recommendations for primary care physician for things to follow:   1) you are strongly advised to quit smoking--- okay to use over-the-counter nicotine patch to help you quit smoking 2) please take your blood pressure prescriptions as prescribed 3) very low-salt diet advised 4)follow up with Health, [facility] --the primary care provider for blood pressure recheck within 4 to 6 days   Admission Diagnosis  Atypical chest pain [R07.89] Hypertensive urgency [I16.0]   Discharge Diagnosis  Atypical chest pain [R07.89] Hypertensive urgency [I16.0]    Principal Problem:   Obesity (BMI 30.0-34.9) Active Problems:   Hypertensive urgency   Chronic heart failure with preserved ejection fraction (HFpEF) /chronic diastolic dysfunction CHF   Nicotine dependence   HTN (hypertension)   Hypokalemia   Chest pain      Past Medical History:  Diagnosis Date   Hypertension    Kidney calculus     Past Surgical History:  Procedure Laterality Date   ANKLE SURGERY     right   MOUTH SURGERY       HPI  from the history and physical done on the day of admission:     Chief Complaint: Chest pain   HPI: Mark Glenn is a 35 y.o. male with medical history significant for hypertension and nicotine dependence who presents to the ER for evaluation of chest pain which he has had intermittently for 4 days but worse on the night prior to his admission. Patient states that he was at work, works as a Training and development officer at [employer] when he developed chest pain which was midsternal and described as someone squeezing his chest.  He rated his pain a 5 x 10 in intensity at its worst.  Pain was nonradiating and he denies  any associated nausea, no vomiting, no palpitations or diaphoresis. He denies having any cough, no fever, no chills, no dizziness, no lightheadedness, no lower extremity swelling, no abdominal pain, no urinary symptoms, no headache, no changes in his mental status, no focal deficits or blurred vision. He has a family history of congestive heart failure and father died in his 92s from heart disease. Labs show sodium 138, potassium 3.4, chloride 107, bicarb 23, glucose 87, BUN 19, creatinine 0.95, calcium 8.6, alkaline phosphatase 57, albumin 4.4, AST 22, ALT 23, total protein 7.5, BNP 36, white count 10.6, hemoglobin 15.0, hematocrit 43.6, MCV 91.0, RDW 13.5, platelet count 255 Chest x-ray reviewed by me shows no evidence of acute cardiopulmonary disease Twelve-lead EKG reviewed by me shows normal EKG with no acute ST or T wave changes.     ED Course: Patient is a 35 year old Caucasian male with a history of hypertension and nicotine dependence who presents to the ER for evaluation of chest pain and found to have significantly elevated blood pressure. He received a  dose of labetalol 20 mg IV and hydralazine 5 mg with systolic blood pressure still in the 170s. He will be referred to observation status for further evaluation.         Review of Systems: As per HPI otherwise all other systems reviewed and negative.      Hospital Course:     1)HTN-patient admitted with hypertensive urgency, required IV nicardipine drip, -BP improved, started on oral BP medications including amlodipine and hydralazine -Echocardiogram with EF of 60 to 65% with grade 1 diastolic dysfunction -The need to be compliant with BP medications emphasized to patient, outpatient follow-up with PCP for BP recheck advised -Low-salt diet and smoking cessation also advised -UDS positive for THC otherwise negative  2) headaches and atypical chest pain in the setting of hypertensive urgency--- cardiology input appreciated -BNP,  EKG and troponin reassuring -Echocardiogram without significant wall motion normalities --No further headaches, no further chest pains, no dyspnea  3) hypokalemia--- suspect due to HCTZ use, patient will need to be on potassium supplementation while on HCTZ  Discharge Condition: stable  Follow UP--- PCP as advised   Consults obtained -cardiology  Diet and Activity recommendation:  As advised  Discharge Instructions    Discharge Instructions     Call MD for:  persistant dizziness or light-headedness   Complete by: As directed    Call MD for:  persistant nausea and vomiting   Complete by: As directed    Call MD for:  temperature >100.4   Complete by: As directed    Diet - low sodium heart healthy   Complete by: As directed    Discharge instructions   Complete by: As directed    1) you are strongly advised to quit smoking--- okay to use over-the-counter nicotine patch to help you quit smoking 2) please take your blood pressure prescriptions as prescribed 3) very low-salt diet advised 4)follow up with Health, [facility] --the primary care provider for blood pressure recheck within 4 to 6 days   Increase activity slowly   Complete by: As directed         Discharge Medications     Allergies as of 09/17/2020   No Known Allergies      Medication List     STOP taking these medications    ibuprofen 200 MG tablet Commonly known as: ADVIL   ondansetron 4 MG disintegrating tablet Commonly known as: Zofran ODT   oxyCODONE-acetaminophen 5-325 MG tablet Commonly known as: PERCOCET/ROXICET       TAKE these medications    acetaminophen 325 MG tablet Commonly known as: TYLENOL Take 2 tablets (650 mg total) by mouth every 6 (six) hours as needed for mild pain (or Fever >/= 101).   amLODipine 10 MG tablet Commonly known as: NORVASC Take 1 tablet (10 mg total) by mouth daily. Start taking on: September 18, 2020   aspirin EC 81 MG tablet Take 1 tablet (81  mg total) by mouth daily with breakfast. Swallow whole. What changed: when to take this   hydrALAZINE 50 MG tablet Commonly known as: APRESOLINE Take 1 tablet (50 mg total) by mouth 2 (two) times daily.   lisinopril-hydrochlorothiazide 20-12.5 MG tablet Commonly known as: ZESTORETIC Take 1 tablet by mouth daily.   nicotine 21 mg/24hr patch Commonly known as: NICODERM CQ - dosed in mg/24 hours Place 1 patch (21 mg total) onto the skin once for 1 dose.   ONE-A-DAY MENS PO Take 1 tablet by mouth daily.   potassium chloride  10 MEQ tablet Commonly known as: KLOR-CON Take 1 tablet (10 mEq total) by mouth daily. Take While taking HCTZ       Major procedures and Radiology Reports - PLEASE review detailed and final reports for all details, in brief -  DG Chest Port 1 View  Result Date: 09/16/2020 CLINICAL DATA:  Chest pain and shortness of breath for a few days. EXAM: PORTABLE CHEST 1 VIEW COMPARISON:  Chest radiographs and CTA 04/13/2020 FINDINGS: The cardiomediastinal silhouette is within normal limits. The lungs are well inflated and clear. There is no evidence of pleural effusion or pneumothorax. No acute osseous abnormality is identified. IMPRESSION: No active disease. Electronically Signed   By: Logan Bores M.D.   On: 09/16/2020 14:28   ECHOCARDIOGRAM COMPLETE  Result Date: 09/17/2020    ECHOCARDIOGRAM REPORT   Patient Name:   Mark Glenn Date of Exam: 09/17/2020 Medical Rec #:  [id number]   Height:       70.0 in Accession #:    [id number]  Weight:       220.0 lb Date of Birth:  07/12/1985  BSA:          2.174 m Patient Age:    70 years    BP:           147/66 mmHg Patient Gender: M           HR:           62 bpm. Exam Location:  [facility] Procedure: 2D Echo, Cardiac Doppler and Color Doppler Indications:    R07.9* Chest pain, unspecified  History:        Patient has no prior history of Echocardiogram examinations.                 Risk Factors:Hypertension.  Sonographer:    Tiffany  Dance Referring Phys: [id number] [place]  1. Left ventricular ejection fraction, by estimation, is 60 to 65%. The left ventricle has normal function. The left ventricle has no regional wall motion abnormalities. There is mild concentric left ventricular hypertrophy. Left ventricular diastolic parameters are consistent with Grade I diastolic dysfunction (impaired relaxation).  2. Right ventricular systolic function is normal. The right ventricular size is normal. There is normal pulmonary artery systolic pressure.  3. Left atrial size was mildly dilated.  4. The mitral valve is normal in structure. No evidence of mitral valve regurgitation. No evidence of mitral stenosis.  5. The aortic valve is tricuspid. Aortic valve regurgitation is not visualized. No aortic stenosis is present.  6. The inferior vena cava is dilated in size with >50% respiratory variability, suggesting right atrial pressure of 8 mmHg. FINDINGS  Left Ventricle: Left ventricular ejection fraction, by estimation, is 60 to 65%. The left ventricle has normal function. The left ventricle has no regional wall motion abnormalities. The left ventricular internal cavity size was normal in size. There is  mild concentric left ventricular hypertrophy. Left ventricular diastolic parameters are consistent with Grade I diastolic dysfunction (impaired relaxation). Indeterminate filling pressures. Right Ventricle: The right ventricular size is normal. No increase in right ventricular wall thickness. Right ventricular systolic function is normal. There is normal pulmonary artery systolic pressure. The tricuspid regurgitant velocity is 1.84 m/s, and  with an assumed right atrial pressure of 8 mmHg, the estimated right ventricular systolic pressure is 07.6 mmHg. Left Atrium: Left atrial size was mildly dilated. Right Atrium: Right atrial size was normal in size. Pericardium: There is no evidence of pericardial effusion.  Mitral Valve: The mitral  valve is normal in structure. No evidence of mitral valve regurgitation. No evidence of mitral valve stenosis. Tricuspid Valve: The tricuspid valve is normal in structure. Tricuspid valve regurgitation is trivial. No evidence of tricuspid stenosis. Aortic Valve: The aortic valve is tricuspid. Aortic valve regurgitation is not visualized. No aortic stenosis is present. Pulmonic Valve: The pulmonic valve was normal in structure. Pulmonic valve regurgitation is not visualized. No evidence of pulmonic stenosis. Aorta: The aortic root is normal in size and structure. Venous: The inferior vena cava is dilated in size with greater than 50% respiratory variability, suggesting right atrial pressure of 8 mmHg. IAS/Shunts: No atrial level shunt detected by color flow Doppler.  LEFT VENTRICLE PLAX 2D LVIDd:         5.67 cm  Diastology LVIDs:         3.86 cm  LV e' medial:    6.83 cm/s LV PW:         1.20 cm  LV E/e' medial:  9.5 LV IVS:        1.21 cm  LV e' lateral:   8.67 cm/s LVOT diam:     2.20 cm  LV E/e' lateral: 7.5 LV SV:         96 LV SV Index:   44 LVOT Area:     3.80 cm  RIGHT VENTRICLE RV Basal diam:  3.14 cm RV Mid diam:    2.06 cm RV S prime:     20.50 cm/s TAPSE (M-mode): 2.3 cm LEFT ATRIUM             Index       RIGHT ATRIUM           Index LA diam:        4.80 cm 2.21 cm/m  RA Area:     19.30 cm LA Vol (A2C):   62.3 ml 28.66 ml/m RA Volume:   60.10 ml  27.65 ml/m LA Vol (A4C):   65.7 ml 30.23 ml/m LA Biplane Vol: 66.4 ml 30.55 ml/m  AORTIC VALVE LVOT Vmax:   142.00 cm/s LVOT Vmean:  97.700 cm/s LVOT VTI:    0.253 m  AORTA Ao Root diam: 3.40 cm Ao Asc diam:  3.20 cm MITRAL VALVE               TRICUSPID VALVE MV Area (PHT): 2.34 cm    TR Peak grad:   13.5 mmHg MV Decel Time: 324 msec    TR Vmax:        184.00 cm/s MV E velocity: 64.90 cm/s MV A velocity: 92.70 cm/s  SHUNTS MV E/A ratio:  0.70        Systemic VTI:  0.25 m                            Systemic Diam: 2.20 cm Skeet Latch md Electronically  signed by Skeet Latch md Signature Date/Time: 09/17/2020/2:08:09 PM    Final     Micro Results   Recent Results (from the past 240 hour(s))  SARS CORONAVIRUS 2 (TAT 6-24 HRS) Nasopharyngeal Nasopharyngeal Swab     Status: None   Collection Time: 09/16/20  6:04 PM   Specimen: Nasopharyngeal Swab  Result Value Ref Range Status   SARS Coronavirus 2 NEGATIVE NEGATIVE Final    Comment: (NOTE) SARS-CoV-2 target nucleic acids are NOT DETECTED.  The SARS-CoV-2 RNA is generally detectable in upper and lower respiratory  specimens during the acute phase of infection. Negative results do not preclude SARS-CoV-2 infection, do not rule out co-infections with other pathogens, and should not be used as the sole basis for treatment or other patient management decisions. Negative results must be combined with clinical observations, patient history, and epidemiological information. The expected result is Negative.  Fact Sheet for Patients: SugarRoll.be  Fact Sheet for Healthcare Providers: https://www.woods-mathews.com/  This test is not yet approved or cleared by the [place] FDA and  has been authorized for detection and/or diagnosis of SARS-CoV-2 by FDA under an Emergency Use Authorization (EUA). This EUA will remain  in effect (meaning this test can be used) for the duration of the COVID-19 declaration under Se ction 564(b)(1) of the Act, 21 U.S.C. section 360bbb-3(b)(1), unless the authorization is terminated or revoked sooner.  Performed at [facility], [place] [address], [place], [place] [address]   MRSA Next Gen by PCR, Nasal     Status: None   Collection Time: 09/17/20  2:35 AM   Specimen: Nasal Mucosa; Nasal Swab  Result Value Ref Range Status   MRSA by PCR Next Gen NOT DETECTED NOT DETECTED Final    Comment: (NOTE) The GeneXpert MRSA Assay (FDA approved for NASAL specimens only), is one component of a comprehensive MRSA  colonization surveillance program. It is not intended to diagnose MRSA infection nor to guide or monitor treatment for MRSA infections. Test performance is not FDA approved in patients less than 37 years old. Performed at [facility], [address], [place], [place] [address]     Today   Subjective    Mark Glenn today has no further chest pains, no further headaches, no shortness of breath, no dyspnea on exertion, No fever  Or chills   No Nausea, Vomiting or Diarrhea         Patient has been seen and examined prior to discharge   Objective   Blood pressure (!) 148/82, pulse 74, temperature 98.6 F (37 C), temperature source Oral, resp. rate 19, height 5' 10"  (1.778 m), weight 99.8 kg, SpO2 97 %.  Intake/Output Summary (Last 24 hours) at 09/17/2020 1543 Last data filed at 09/17/2020 1536 Gross per 24 hour  Intake 1581.36 ml  Output 2450 ml  Net -868.64 ml   Exam Gen:- Awake Alert, no acute distress  HEENT:- .AT, No sclera icterus Neck-Supple Neck,No JVD,.  Lungs-  CTAB , good air movement bilaterally  CV- S1, S2 normal, regular Abd-  +ve B.Sounds, Abd Soft, No tenderness,    Extremity/Skin:- No  edema,   good pulses Psych-affect is appropriate, oriented x3 Neuro-no new focal deficits, no tremors    Data Review   CBC w Diff:  Lab Results  Component Value Date   WBC 10.5 09/17/2020   HGB 15.0 09/17/2020   HCT 43.4 09/17/2020   PLT 261 09/17/2020   LYMPHOPCT 25 09/16/2020   MONOPCT 6 09/16/2020   EOSPCT 5 09/16/2020   BASOPCT 1 09/16/2020   CMP:  Lab Results  Component Value Date   NA 136 09/17/2020   K 3.2 (L) 09/17/2020   CL 106 09/17/2020   CO2 23 09/17/2020   BUN 14 09/17/2020   CREATININE 0.80 09/17/2020   PROT 7.5 09/16/2020   ALBUMIN 4.4 09/16/2020   BILITOT 0.7 09/16/2020   ALKPHOS 57 09/16/2020   AST 22 09/16/2020   ALT 23 09/16/2020    Total Discharge time is about 33 minutes  Roxan Hockey M.D on 09/17/2020 at  3:43 PM  Go to  [email or web address] -  for contact info  [facility] - Office  [phone]

## 2020-09-22 LAB — ALDOSTERONE + RENIN ACTIVITY W/ RATIO
ALDO / PRA Ratio: UNDETERMINED
Aldosterone: <1 ng/dL (ref 0.0–30.0)

## 2020-10-29 ENCOUNTER — Ambulatory Visit
Admission: EM | Admit: 2020-10-29 | Discharge: 2020-10-29 | Disposition: A | Discharge status: Home / Self Care | Payer: Medicaid Other | Attending: Emergency Medicine | Admitting: Emergency Medicine

## 2020-10-29 ENCOUNTER — Encounter: Payer: Self-pay | Admitting: Emergency Medicine

## 2020-10-29 DIAGNOSIS — R509 Fever, unspecified: Secondary | ICD-10-CM

## 2020-10-29 DIAGNOSIS — R5081 Fever presenting with conditions classified elsewhere: Secondary | ICD-10-CM

## 2020-10-29 MED ORDER — BENZONATATE 100 MG PO CAPS: 100.0000 mg | ORAL_CAPSULE | Freq: Three times a day (TID) | ORAL | 0 refills | Status: DC

## 2020-10-29 NOTE — ED Provider Notes (Signed)
[facility]   [id number] 10/29/20 Arrival Time: 1208   CC: COVID symptoms  SUBJECTIVE: History from: patient.  Mark Glenn is a 35 y.o. male who presents with fever, fatigue, SOB x 3 days.  Denies sick exposure to COVID, flu or strep.  Denies alleviating or aggravating factors.  Denies previous covid infection in the past.   Denies wheezing, chest pain, nausea, changes in bowel or bladder habits.     ROS: As per HPI.  All other pertinent ROS negative.     Past Medical History:  Diagnosis Date   Hypertension    Kidney calculus    Past Surgical History:  Procedure Laterality Date   ANKLE SURGERY     right   MOUTH SURGERY     No Known Allergies No current facility-administered medications on file prior to encounter.   Current Outpatient Medications on File Prior to Encounter  Medication Sig Dispense Refill   acetaminophen (TYLENOL) 325 MG tablet Take 2 tablets (650 mg total) by mouth every 6 (six) hours as needed for mild pain (or Fever >/= 101). 30 tablet 3   amLODipine (NORVASC) 10 MG tablet Take 1 tablet (10 mg total) by mouth daily. 30 tablet 5   aspirin EC 81 MG tablet Take 1 tablet (81 mg total) by mouth daily with breakfast. Swallow whole. 30 tablet 11   hydrALAZINE (APRESOLINE) 50 MG tablet Take 1 tablet (50 mg total) by mouth 2 (two) times daily. 60 tablet 5   lisinopril-hydrochlorothiazide (ZESTORETIC) 20-12.5 MG tablet Take 1 tablet by mouth daily. 30 tablet 5   Multiple Vitamin (ONE-A-DAY MENS PO) Take 1 tablet by mouth daily.     potassium chloride (KLOR-CON) 10 MEQ tablet Take 1 tablet (10 mEq total) by mouth daily. Take While taking HCTZ 30 tablet 2   Social History   Socioeconomic History   Marital status: Married    Spouse name: Not on file   Number of children: Not on file   Years of education: Not on file   Highest education level: Not on file  Occupational History   Not on file  Tobacco Use   Smoking status: Every Day    Packs/day: 0.50     Types: Cigarettes   Smokeless tobacco: Never  Vaping Use   Vaping Use: Never used  Substance and Sexual Activity   Alcohol use: Yes   Drug use: Never   Sexual activity: Not on file  Other Topics Concern   Not on file  Social History Narrative   Not on file   Social Determinants of Health   Financial Resource Strain: Not on file  Food Insecurity: Not on file  Transportation Needs: Not on file  Physical Activity: Not on file  Stress: Not on file  Social Connections: Not on file  Intimate Partner Violence: Not on file   Family History  Problem Relation Age of Onset   Heart failure Father     OBJECTIVE:  Vitals:   10/29/20 1245  BP: 130/82  Pulse: 82  Resp: 17  Temp: 98.9 F (37.2 C)  TempSrc: Oral  SpO2: 97%     General appearance: alert; appears fatigued, but nontoxic; speaking in full sentences and tolerating own secretions HEENT: NCAT; Ears: EACs clear, TMs pearly gray; Eyes: PERRL.  EOM grossly intact. Sinuses: nontender; Nose: nares patent without rhinorrhea, Throat: oropharynx clear, tonsils non erythematous or enlarged, uvula midline  Neck: supple without LAD Lungs: unlabored respirations, symmetrical air entry; cough: absent; no respiratory distress; CTAB  Heart: regular rate and rhythm.  Radial pulses 2+ symmetrical bilaterally Skin: warm and dry Psychological: alert and cooperative; normal mood and affect  LABS:  No results found for this or any previous visit (from the past 24 hour(s)).   ASSESSMENT & PLAN:  1. Fever in other diseases     Meds ordered this encounter  Medications   benzonatate (TESSALON) 100 MG capsule    Sig: Take 1 capsule (100 mg total) by mouth every 8 (eight) hours.    Dispense:  21 capsule    Refill:  0    Order Specific Question:   Supervising Provider    Answer:   Raylene Everts [[id number]]   COVID testing ordered.  It will take between 5-7 days for test results.  Someone will contact you regarding abnormal  results.    In the meantime: You should remain isolated in your home for 5 days from symptom onset AND greater than 72 hours after symptoms resolution (absence of fever without the use of fever-reducing medication and improvement in respiratory symptoms), whichever is longer Get plenty of rest and push fluids Tessalon Perles prescribed for cough Use OTC zyrtec for nasal congestion, runny nose, and/or sore throat Use OTC flonase for nasal congestion and runny nose Use medications daily for symptom relief Use OTC medications like ibuprofen or tylenol as needed fever or pain Call or go to the ED if you have any new or worsening symptoms such as fever, worsening cough, shortness of breath, chest tightness, chest pain, turning blue, changes in mental status, etc...   Reviewed expectations re: course of current medical issues. Questions answered. Outlined signs and symptoms indicating need for more acute intervention. Patient verbalized understanding. After Visit Summary given.          Lestine Box, PA-C 10/29/20 1310

## 2020-10-29 NOTE — ED Triage Notes (Signed)
Fever, fatigue, shortness of breath x 3 days.  Neg rapid covid test. Unable to go to work tonight.

## 2020-10-29 NOTE — Discharge Instructions (Signed)
COVID testing ordered.  It will take between 5-7 days for test results.  Someone will contact you regarding abnormal results.    In the meantime: You should remain isolated in your home for 5 days from symptom onset AND greater than 72 hours after symptoms resolution (absence of fever without the use of fever-reducing medication and improvement in respiratory symptoms), whichever is longer Get plenty of rest and push fluids Tessalon Perles prescribed for cough Use OTC zyrtec for nasal congestion, runny nose, and/or sore throat Use OTC flonase for nasal congestion and runny nose Use medications daily for symptom relief Use OTC medications like ibuprofen or tylenol as needed fever or pain Call or go to the ED if you have any new or worsening symptoms such as fever, worsening cough, shortness of breath, chest tightness, chest pain, turning blue, changes in mental status, etc..Marland Kitchen

## 2020-10-30 LAB — COVID-19, FLU A+B NAA
Influenza A, NAA: NOT DETECTED
Influenza B, NAA: NOT DETECTED
SARS-CoV-2, NAA: NOT DETECTED

## 2021-01-13 ENCOUNTER — Emergency Department (HOSPITAL_COMMUNITY): Payer: Medicaid Other

## 2021-01-13 ENCOUNTER — Other Ambulatory Visit: Payer: Self-pay

## 2021-01-13 ENCOUNTER — Emergency Department (HOSPITAL_COMMUNITY)
Admission: EM | Admit: 2021-01-13 | Discharge: 2021-01-13 | Disposition: A | Discharge status: Home / Self Care | Payer: Medicaid Other | Attending: Emergency Medicine | Admitting: Emergency Medicine

## 2021-01-13 DIAGNOSIS — Z79899 Other long term (current) drug therapy: Secondary | ICD-10-CM | POA: Insufficient documentation

## 2021-01-13 DIAGNOSIS — R112 Nausea with vomiting, unspecified: Secondary | ICD-10-CM | POA: Insufficient documentation

## 2021-01-13 DIAGNOSIS — I5032 Chronic diastolic (congestive) heart failure: Secondary | ICD-10-CM | POA: Insufficient documentation

## 2021-01-13 DIAGNOSIS — Z7982 Long term (current) use of aspirin: Secondary | ICD-10-CM | POA: Diagnosis not present

## 2021-01-13 DIAGNOSIS — R1013 Epigastric pain: Secondary | ICD-10-CM | POA: Insufficient documentation

## 2021-01-13 DIAGNOSIS — F17210 Nicotine dependence, cigarettes, uncomplicated: Secondary | ICD-10-CM | POA: Insufficient documentation

## 2021-01-13 DIAGNOSIS — I110 Hypertensive heart disease with heart failure: Secondary | ICD-10-CM | POA: Diagnosis not present

## 2021-01-13 LAB — URINALYSIS, ROUTINE W REFLEX MICROSCOPIC
Bilirubin Urine: NEGATIVE
Glucose, UA: NEGATIVE mg/dL
Hgb urine dipstick: NEGATIVE
Ketones, ur: NEGATIVE mg/dL
Leukocytes,Ua: NEGATIVE
Nitrite: NEGATIVE
Protein, ur: NEGATIVE mg/dL
Specific Gravity, Urine: 1.02 (ref 1.005–1.030)
pH: 7 (ref 5.0–8.0)

## 2021-01-13 LAB — COMPREHENSIVE METABOLIC PANEL
ALT: 31 U/L (ref 0–44)
AST: 22 U/L (ref 15–41)
Albumin: 4.1 g/dL (ref 3.5–5.0)
Alkaline Phosphatase: 48 U/L (ref 38–126)
Anion gap: 8 (ref 5–15)
BUN: 16 mg/dL (ref 6–20)
CO2: 25 mmol/L (ref 22–32)
Calcium: 8.9 mg/dL (ref 8.9–10.3)
Chloride: 106 mmol/L (ref 98–111)
Creatinine, Ser: 0.98 mg/dL (ref 0.61–1.24)
GFR, Estimated: >60 mL/min (ref >60)
Glucose, Bld: 93 mg/dL (ref 70–99)
Potassium: 3.8 mmol/L (ref 3.5–5.1)
Sodium: 139 mmol/L (ref 135–145)
Total Bilirubin: 0.6 mg/dL (ref 0.3–1.2)
Total Protein: 7.1 g/dL (ref 6.5–8.1)

## 2021-01-13 LAB — CBC WITH DIFFERENTIAL/PLATELET
Abs Immature Granulocytes: 0.02 10*3/uL (ref 0.00–0.07)
Basophils Absolute: 0.1 10*3/uL (ref 0.0–0.1)
Basophils Relative: 1 %
Eosinophils Absolute: 0.4 10*3/uL (ref 0.0–0.5)
Eosinophils Relative: 6 %
HCT: 39.6 % (ref 39.0–52.0)
Hemoglobin: 13.5 g/dL (ref 13.0–17.0)
Immature Granulocytes: 0 %
Lymphocytes Relative: 30 %
Lymphs Abs: 2 10*3/uL (ref 0.7–4.0)
MCH: 31.6 pg (ref 26.0–34.0)
MCHC: 34.1 g/dL (ref 30.0–36.0)
MCV: 92.7 fL (ref 80.0–100.0)
Monocytes Absolute: 0.5 10*3/uL (ref 0.1–1.0)
Monocytes Relative: 7 %
Neutro Abs: 3.8 10*3/uL (ref 1.7–7.7)
Neutrophils Relative %: 56 %
Platelets: 303 10*3/uL (ref 150–400)
RBC: 4.27 MIL/uL (ref 4.22–5.81)
RDW: 12.8 % (ref 11.5–15.5)
WBC: 6.7 10*3/uL (ref 4.0–10.5)
nRBC: 0 % (ref 0.0–0.2)

## 2021-01-13 LAB — LIPASE, BLOOD: Lipase: 27 U/L (ref 11–51)

## 2021-01-13 MED ORDER — HYDROCODONE-ACETAMINOPHEN 5-325 MG PO TABS: 1.0000 | ORAL_TABLET | Freq: Four times a day (QID) | ORAL | 0 refills | Status: DC | PRN

## 2021-01-13 MED ORDER — PANTOPRAZOLE SODIUM 40 MG IV SOLR
40.0000 mg | Freq: Once | INTRAVENOUS | Status: AC
  Administered 2021-01-13: 40 mg via INTRAVENOUS
  Filled 2021-01-13: qty 40

## 2021-01-13 MED ORDER — MORPHINE SULFATE (PF) 4 MG/ML IV SOLN
4.0000 mg | Freq: Once | INTRAVENOUS | Status: AC
  Administered 2021-01-13: 4 mg via INTRAVENOUS
  Filled 2021-01-13: qty 1

## 2021-01-13 MED ORDER — ONDANSETRON HCL 4 MG PO TABS: 4.0000 mg | ORAL_TABLET | Freq: Four times a day (QID) | ORAL | 0 refills | Status: AC

## 2021-01-13 MED ORDER — PANTOPRAZOLE SODIUM 20 MG PO TBEC: 20.0000 mg | DELAYED_RELEASE_TABLET | Freq: Every day | ORAL | 0 refills | Status: DC

## 2021-01-13 MED ORDER — ONDANSETRON HCL 4 MG/2ML IJ SOLN
4.0000 mg | Freq: Once | INTRAMUSCULAR | Status: AC
  Administered 2021-01-13: 4 mg via INTRAVENOUS
  Filled 2021-01-13: qty 2

## 2021-01-13 NOTE — ED Triage Notes (Signed)
Pt denies cholecystectomy, nvd, and does not take GERD meds.

## 2021-01-13 NOTE — ED Provider Notes (Signed)
[facility] EMERGENCY DEPARTMENT Provider Note   CSN: [id number] Arrival date & time: 01/13/21  1308     History Chief Complaint  Patient presents with   Abdominal Pain    Mark Glenn is a 35 y.o. male with a history of hypertension and kidney stones, prior history of CHF, presenting for evaluation of abdominal pain.  He describes intermittent epigastric pain for the past month, the last episode woke him from sleep this morning.  He describes sharp pain without radiation along with nausea and emesis, however he states the emesis only occurs when he eats greasy foods such as pizza or burgers.  His symptoms are also worsened by such food intake.  He states he can eat no fat foods without developing the symptoms.  He denies chest pain or shortness of breath, has had no fevers or chills.  He denies abdominal distention, no diarrhea or constipation.  He has tried OTC antacids without relief of his symptoms.  He denies classic reflux symptoms however.  The history is provided by the patient.      Past Medical History:  Diagnosis Date   Hypertension    Kidney calculus     Patient Active Problem List   Diagnosis Date Noted   Chronic heart failure with preserved ejection fraction (HFpEF) /chronic diastolic dysfunction CHF 23/55/7322   HTN (hypertension) 09/17/2020   Obesity (BMI 30.0-34.9) 09/17/2020   Hypertensive urgency 09/16/2020   Nicotine dependence 09/16/2020   Hypokalemia 09/16/2020   Chest pain 09/16/2020    Past Surgical History:  Procedure Laterality Date   ANKLE SURGERY     right   MOUTH SURGERY         Family History  Problem Relation Age of Onset   Heart failure Father     Social History   Tobacco Use   Smoking status: Every Day    Packs/day: 0.50    Types: Cigarettes   Smokeless tobacco: Never  Vaping Use   Vaping Use: Never used  Substance Use Topics   Alcohol use: Yes   Drug use: Never    Home Medications Prior to Admission medications    Medication Sig Start Date End Date Taking? Authorizing Provider  acetaminophen (TYLENOL) 325 MG tablet Take 2 tablets (650 mg total) by mouth every 6 (six) hours as needed for mild pain (or Fever >/= 101). 09/17/20  Yes Emokpae, Courage, MD  amLODipine (NORVASC) 10 MG tablet Take 1 tablet (10 mg total) by mouth daily. 09/18/20  Yes Roxan Hockey, MD  aspirin EC 81 MG tablet Take 1 tablet (81 mg total) by mouth daily with breakfast. Swallow whole. 09/17/20  Yes Emokpae, Courage, MD  Garlic 0254 MG CAPS Take 1 capsule by mouth daily.   Yes [provider]  HYDROcodone-acetaminophen (NORCO/VICODIN) 5-325 MG tablet Take 1 tablet by mouth every 6 (six) hours as needed for severe pain. 01/13/21  Yes Lidya Mccalister, Almyra Free, PA-C  lisinopril-hydrochlorothiazide (ZESTORETIC) 20-12.5 MG tablet Take 1 tablet by mouth daily. 09/17/20  Yes Emokpae, Courage, MD  Multiple Vitamin (ONE-A-DAY MENS PO) Take 1 tablet by mouth daily.   Yes [provider]  ondansetron (ZOFRAN) 4 MG tablet Take 1 tablet (4 mg total) by mouth every 6 (six) hours. 01/13/21  Yes Zareya Tuckett, Almyra Free, PA-C  pantoprazole (PROTONIX) 20 MG tablet Take 1 tablet (20 mg total) by mouth daily. 01/13/21  Yes Rami Waddle, Almyra Free, PA-C  benzonatate (TESSALON) 100 MG capsule Take 1 capsule (100 mg total) by mouth every 8 (eight) hours. Patient not  taking: Reported on 01/13/2021 10/29/20   Wurst, [place], PA-C  hydrALAZINE (APRESOLINE) 50 MG tablet Take 1 tablet (50 mg total) by mouth 2 (two) times daily. Patient not taking: Reported on 01/13/2021 09/17/20   Roxan Hockey, MD  potassium chloride (KLOR-CON) 10 MEQ tablet Take 1 tablet (10 mEq total) by mouth daily. Take While taking HCTZ Patient not taking: Reported on 01/13/2021 09/17/20   Roxan Hockey, MD    Allergies    Patient has no allergy information on record.  Review of Systems   Review of Systems  Constitutional:  Negative for chills and fever.  HENT:  Negative for congestion and sore  throat.   Eyes: Negative.   Respiratory:  Negative for chest tightness and shortness of breath.   Cardiovascular:  Negative for chest pain.  Gastrointestinal:  Positive for abdominal pain, nausea and vomiting. Negative for constipation and diarrhea.  Genitourinary: Negative.   Musculoskeletal:  Negative for arthralgias, joint swelling and neck pain.  Skin: Negative.  Negative for rash and wound.  Neurological:  Negative for dizziness, weakness, light-headedness, numbness and headaches.  Psychiatric/Behavioral: Negative.    All other systems reviewed and are negative.  Physical Exam Updated Vital Signs BP 114/69   Pulse (!) 49   Temp 98.1 F (36.7 C) (Oral)   Resp 16   Ht 5' 11"  (1.803 m)   Wt 99.8 kg   SpO2 97%   BMI 30.68 kg/m   Physical Exam Vitals and nursing note reviewed.  Constitutional:      Appearance: He is well-developed.  HENT:     Head: Normocephalic and atraumatic.  Eyes:     Conjunctiva/sclera: Conjunctivae normal.  Cardiovascular:     Rate and Rhythm: Normal rate and regular rhythm.     Heart sounds: Normal heart sounds.  Pulmonary:     Effort: Pulmonary effort is normal.     Breath sounds: Normal breath sounds. No wheezing.  Abdominal:     General: Abdomen is protuberant. Bowel sounds are normal.     Palpations: Abdomen is soft.     Tenderness: There is abdominal tenderness in the epigastric area. There is no guarding or rebound. Negative signs include Murphy's sign.  Musculoskeletal:        General: Normal range of motion.     Cervical back: Normal range of motion.  Skin:    General: Skin is warm and dry.  Neurological:     Mental Status: He is alert.    ED Results / Procedures / Treatments   Labs (all labs ordered are listed, but only abnormal results are displayed) Labs Reviewed  URINALYSIS, ROUTINE W REFLEX MICROSCOPIC - Abnormal; Notable for the following components:      Result Value   Color, Urine STRAW (*)    All other components  within normal limits  CBC WITH DIFFERENTIAL/PLATELET  COMPREHENSIVE METABOLIC PANEL  LIPASE, BLOOD    EKG EKG Interpretation  Date/Time:  Friday January 13 2021 13:19:28 EST Ventricular Rate:  67 PR Interval:  157 QRS Duration: 104 QT Interval:  438 QTC Calculation: 463 R Axis:   40 Text Interpretation: Sinus rhythm Low voltage, precordial leads Borderline T abnormalities, anterior leads since last tracing no significant change Confirmed by Daleen Bo [phone]) on 01/13/2021 3:30:15 PM  Radiology US Abdomen Complete  Result Date: 01/13/2021 CLINICAL DATA:  Upper abdominal pain and epigastric pain. EXAM: ABDOMEN ULTRASOUND COMPLETE COMPARISON:  None. FINDINGS: Gallbladder: A very small amount of echogenic sludge is suspected within the gallbladder lumen.  No gallstones or wall thickening visualized (2.3 mm). No sonographic Murphy sign noted by sonographer. Common bile duct: Diameter: 3.2 mm Liver: No focal lesion identified. Diffusely increased echogenicity of the liver parenchyma is noted. Portal vein is patent on color Doppler imaging with normal direction of blood flow towards the liver. IVC: No abnormality visualized. Pancreas: Limited in evaluation secondary to overlying bowel gas. Spleen: Size (8.1 cm) and appearance within normal limits. Right Kidney: Length: 11.6 cm. Echogenicity within normal limits. No mass or hydronephrosis visualized. Left Kidney: Length: 12.7 cm. Echogenicity within normal limits. No mass or hydronephrosis visualized. Abdominal aorta: No aneurysm visualized (2.3 cm in AP diameter). Other findings: The study is limited secondary to overlying bowel gas. IMPRESSION: 1. Small amount of gallbladder sludge. 2. Hepatic steatosis without focal liver lesions. Electronically Signed   By: Virgina Norfolk M.D.   On: 01/13/2021 19:02    Procedures Procedures   Medications Ordered in ED Medications  pantoprazole (PROTONIX) injection 40 mg (40 mg Intravenous Given  01/13/21 1548)  morphine 4 MG/ML injection 4 mg (4 mg Intravenous Given 01/13/21 1557)  ondansetron (ZOFRAN) injection 4 mg (4 mg Intravenous Given 01/13/21 1555)    ED Course  I have reviewed the triage vital signs and the nursing notes.  Pertinent labs & imaging results that were available during my care of the patient were reviewed by me and considered in my medical decision making (see chart for details).    MDM Rules/Calculators/A&P                           Patient with episodes of epigastric pain, predominantly triggered by consumption of fatty foods.  His lab test today are reassuring with a normal lipase, WBC count and LFTs.  His ultrasound does show some fatty liver and gallbladder sludge but no sign of acute cholecystitis.  He would benefit from GI evaluation for further testing, possibly needing a HIDA scan to better define whether patient would benefit from a cholecystectomy which I suspect is the source of these pain symptoms.  He was advised to avoid any fatty intake or is much as possible, he was prescribed Protonix to eliminate the acid reflux component, hydrocodone and Zofran as needed symptoms.  He was referred to Dr. Jenetta Downer for further evaluation.  He was given return precautions for worsening symptoms or development of fever.  The patient appears reasonably screened and/or stabilized for discharge and I doubt any other medical condition or other [facility] requiring further screening, evaluation, or treatment in the ED at this time prior to discharge.  Final Clinical Impression(s) / ED Diagnoses Final diagnoses:  Epigastric pain  Nausea and vomiting, unspecified vomiting type    Rx / DC Orders ED Discharge Orders          Ordered    ondansetron (ZOFRAN) 4 MG tablet  Every 6 hours        01/13/21 1933    HYDROcodone-acetaminophen (NORCO/VICODIN) 5-325 MG tablet  Every 6 hours PRN        01/13/21 1933    pantoprazole (PROTONIX) 20 MG tablet  Daily        01/13/21 1935              Evalee Jefferson, Hershal Coria 01/13/21 1951    Daleen Bo, MD 01/14/21 [phone]

## 2021-01-13 NOTE — ED Triage Notes (Signed)
Intermittent epigastric pain x 1 month. Pt woke up with it this morning and decided to be evaluated for pain. Has not taken any otc meds.

## 2021-01-13 NOTE — Discharge Instructions (Addendum)
Your lab test today are normal, your ultrasound shows a small amount of sludge in your gallbladder which can be a normal finding but could also suggest that you do have some degree of gallbladder involvement with these episodes of pain.  You would benefit from seeing a gastroenterologist and will probably need additional testing to decide if you would benefit from having your gallbladder removed.  Call Dr. Jenetta Downer to arrange an office visit.  In the interim I have prescribed you some medication to help you with your symptoms.  Do not drive within 4 hours of taking hydrocodone as this medication will make you drowsy.  Only use this medicine if you are having an attack of this pain.  I would avoid any greasy foods which are known to trigger your symptoms.  You have also been prescribed Protonix which is a strong acid reducer to eliminate this as part of the problem.  Return here in the interim if you develop any worsened pain that does not resolve, uncontrolled vomiting or if you develop fevers.

## 2021-01-17 ENCOUNTER — Ambulatory Visit: Payer: Medicaid Other | Admitting: Gastroenterology

## 2021-01-17 ENCOUNTER — Encounter: Payer: Self-pay | Admitting: Gastroenterology

## 2021-01-17 VITALS — BP 122/76 | HR 72 | Ht 69.0 in | Wt 231.2 lb

## 2021-01-17 DIAGNOSIS — R1013 Epigastric pain: Secondary | ICD-10-CM | POA: Insufficient documentation

## 2021-01-17 DIAGNOSIS — R197 Diarrhea, unspecified: Secondary | ICD-10-CM | POA: Diagnosis not present

## 2021-01-17 DIAGNOSIS — K625 Hemorrhage of anus and rectum: Secondary | ICD-10-CM | POA: Diagnosis not present

## 2021-01-17 DIAGNOSIS — R112 Nausea with vomiting, unspecified: Secondary | ICD-10-CM

## 2021-01-17 MED ORDER — OMEPRAZOLE 40 MG PO CPDR: 40.0000 mg | DELAYED_RELEASE_CAPSULE | Freq: Every day | ORAL | 5 refills | Status: DC

## 2021-01-17 MED ORDER — NA SULFATE-K SULFATE-MG SULF 17.5-3.13-1.6 GM/177ML PO SOLN: 1.0000 | Freq: Once | ORAL | 0 refills | Status: AC

## 2021-01-17 NOTE — Progress Notes (Signed)
01/17/2021 Mark Glenn [id number] 01-Mar-1985   HISTORY OF PRESENT ILLNESS: This is a 35 year old male who is new to our office.  He is new to the [place] area, moved here about 1.5 years ago.  He tells me that for about the past 3 years or so he has been having epigastric abdominal pain.  This is gotten worse over time.  He also has periodic nausea and vomiting particularly if he eats anything fatty or greasy.  He says that he has tried Tums and several over-the-counter regimens.  Says that he has never seen a gastroenterologist, mostly just goes to the ER but they always tell him that everything was fine.  He had a CT angio of the chest, abdomen, pelvis earlier this year that was unremarkable.  He recently had an abdominal ultrasound that showed gallbladder sludge and now he thinks that is the cause of his symptoms.  He had a positive urine for THC back in July, but says that he is not smoking marijuana any longer.  He says that his bowel habits alternate from watery stools to normal stools.  That has been occurring for longer than 3-year duration.  He has seen blood in his stool on a few occasions.  He is currently on pantoprazole 20 mg daily, but thinks that that may be causing him headache.  Lipase, CBC, and CMP were unremarkable.  Past Medical History:  Diagnosis Date   Hypertension    Kidney calculus    Past Surgical History:  Procedure Laterality Date   ANKLE SURGERY Right    MOUTH SURGERY     due to root canal/crown/abscess    reports that he quit smoking about 4 weeks ago. His smoking use included cigarettes. He smoked an average of .5 packs per day. He has never used smokeless tobacco. He reports that he does not currently use alcohol. He reports that he does not use drugs. family history includes Clotting disorder in his mother; Heart failure in his father; Hypertension in his paternal grandfather. No Known Allergies    Outpatient Encounter Medications as of 01/17/2021   Medication Sig   acetaminophen (TYLENOL) 325 MG tablet Take 2 tablets (650 mg total) by mouth every 6 (six) hours as needed for mild pain (or Fever >/= 101).   amLODipine (NORVASC) 10 MG tablet Take 1 tablet (10 mg total) by mouth daily.   aspirin EC 81 MG tablet Take 1 tablet (81 mg total) by mouth daily with breakfast. Swallow whole.   Garlic 1941 MG CAPS Take 1 capsule by mouth daily.   hydrALAZINE (APRESOLINE) 50 MG tablet Take 1 tablet (50 mg total) by mouth 2 (two) times daily.   HYDROcodone-acetaminophen (NORCO/VICODIN) 5-325 MG tablet Take 1 tablet by mouth every 6 (six) hours as needed for severe pain.   lisinopril-hydrochlorothiazide (ZESTORETIC) 20-12.5 MG tablet Take 1 tablet by mouth daily.   Multiple Vitamin (ONE-A-DAY MENS PO) Take 1 tablet by mouth daily.   ondansetron (ZOFRAN) 4 MG tablet Take 1 tablet (4 mg total) by mouth every 6 (six) hours.   pantoprazole (PROTONIX) 20 MG tablet Take 1 tablet (20 mg total) by mouth daily.   potassium chloride (KLOR-CON) 10 MEQ tablet Take 1 tablet (10 mEq total) by mouth daily. Take While taking HCTZ   [DISCONTINUED] benzonatate (TESSALON) 100 MG capsule Take 1 capsule (100 mg total) by mouth every 8 (eight) hours. (Patient not taking: Reported on 01/13/2021)   No facility-administered encounter medications on file as of 01/17/2021.  REVIEW OF SYSTEMS  : All other systems reviewed and negative except where noted in the History of Present Illness.   PHYSICAL EXAM: BP 122/76 (BP Location: Left Arm, Patient Position: Sitting, Cuff Size: Normal)   Pulse 72   Ht 5' 9"  (1.753 m) Comment: height measured without shoes  Wt 231 lb 4 oz (104.9 kg)   BMI 34.15 kg/m  General: Well developed white male in no acute distress Head: Normocephalic and atraumatic Eyes:  Sclerae anicteric, conjunctiva pink. Ears: Normal auditory acuity Lungs: Clear throughout to auscultation; no W/R/R. Heart: Regular rate and rhythm; no M/R/G. Abdomen: Soft,  non-distended.  BS present.  Epigastric TTP. Rectal:  Will be done at the time of colonoscopy. Musculoskeletal: Symmetrical with no gross deformities  Skin: No lesions on visible extremities Extremities: No edema  Neurological: Alert oriented x 4, grossly non-focal Psychological:  Alert and cooperative. Normal mood and affect  ASSESSMENT AND PLAN: *Epigastric abdominal pain: Says that it has been present for a few years, but getting worse.  Has periodic nausea and vomiting particularly if eating greasy or fatty foods.  Abdominal ultrasound shows gallbladder sludge and now he is thinking that that is what is causing his symptoms.  CT angio chest/abdomen/pelvis earlier this year unremarkable.  Due to the location of his symptoms I think we need to do an endoscopy first to rule out esophagitis, etc.  If EGD is negative that he could consider seeing a surgeon for their opinion regarding cholecystectomy.  He thinks pantoprazole may be causing him headaches so I am going to switch his PPI to omeprazole and increase it to 40 mg daily in the interim. *Diarrhea: Says that his stools alternate from watery diarrhea to normal stools.  Has had bright red rectal bleeding on a few occasions.  We will plan for colonoscopy to rule out colitis, etc. as well.  **Procedures scheduled with Dr. Rush Landmark.  The risks, benefits, and alternatives to EGD and colonoscopy were discussed with the patient and he consents to proceed.  CC:  [facility]*

## 2021-01-17 NOTE — Patient Instructions (Signed)
We have sent the following medications to your pharmacy for you to pick up at your convenience: Omeprazole 40 mg daily 30-60 minutes before breakfast.   You have been scheduled for a colonoscopy. Please follow written instructions given to you at your visit today.  Please pick up your prep supplies at the pharmacy within the next 1-3 days. If you use inhalers (even only as needed), please bring them with you on the day of your procedure.  If you are age 33 or older, your body mass index should be between 23-30. Your Body mass index is 34.15 kg/m. If this is out of the aforementioned range listed, please consider follow up with your Primary Care Provider.  If you are age 90 or younger, your body mass index should be between 19-25. Your Body mass index is 34.15 kg/m. If this is out of the aformentioned range listed, please consider follow up with your Primary Care Provider.   ________________________________________________________  The [facility] providers would like to encourage you to use [facility] to communicate with providers for non-urgent requests or questions.  Due to long hold times on the telephone, sending your provider a message by [facility] may be a faster and more efficient way to get a response.  Please allow 48 business hours for a response.  Please remember that this is for non-urgent requests.  _______________________________________________________

## 2021-01-18 NOTE — Progress Notes (Signed)
Attending Physician's Attestation   I have reviewed the chart.   I agree with the Advanced Practitioner's note, impression, and recommendations with any updates as below.    Misheel Gowans Mansouraty, MD [facility] Advanced Endoscopy Office # [phone]  

## 2021-01-25 ENCOUNTER — Other Ambulatory Visit: Payer: Self-pay

## 2021-01-25 ENCOUNTER — Encounter: Payer: Self-pay | Admitting: Gastroenterology

## 2021-01-25 ENCOUNTER — Ambulatory Visit (AMBULATORY_SURGERY_CENTER): Payer: Medicaid Other | Admitting: Gastroenterology

## 2021-01-25 VITALS — BP 110/67 | HR 52 | Temp 98.6°F | Resp 15 | Ht 69.0 in | Wt 231.0 lb

## 2021-01-25 DIAGNOSIS — K2980 Duodenitis without bleeding: Secondary | ICD-10-CM

## 2021-01-25 DIAGNOSIS — K5289 Other specified noninfective gastroenteritis and colitis: Secondary | ICD-10-CM | POA: Diagnosis not present

## 2021-01-25 DIAGNOSIS — K259 Gastric ulcer, unspecified as acute or chronic, without hemorrhage or perforation: Secondary | ICD-10-CM | POA: Diagnosis not present

## 2021-01-25 DIAGNOSIS — K641 Second degree hemorrhoids: Secondary | ICD-10-CM | POA: Diagnosis not present

## 2021-01-25 DIAGNOSIS — K635 Polyp of colon: Secondary | ICD-10-CM

## 2021-01-25 DIAGNOSIS — D127 Benign neoplasm of rectosigmoid junction: Secondary | ICD-10-CM

## 2021-01-25 DIAGNOSIS — K449 Diaphragmatic hernia without obstruction or gangrene: Secondary | ICD-10-CM | POA: Diagnosis not present

## 2021-01-25 DIAGNOSIS — K2950 Unspecified chronic gastritis without bleeding: Secondary | ICD-10-CM

## 2021-01-25 DIAGNOSIS — R1013 Epigastric pain: Secondary | ICD-10-CM

## 2021-01-25 DIAGNOSIS — K2970 Gastritis, unspecified, without bleeding: Secondary | ICD-10-CM | POA: Diagnosis not present

## 2021-01-25 DIAGNOSIS — R197 Diarrhea, unspecified: Secondary | ICD-10-CM | POA: Diagnosis not present

## 2021-01-25 MED ORDER — OMEPRAZOLE 40 MG PO CPDR: 40.0000 mg | DELAYED_RELEASE_CAPSULE | Freq: Two times a day (BID) | ORAL | 5 refills | Status: DC

## 2021-01-25 MED ORDER — SODIUM CHLORIDE 0.9 % IV SOLN: 500.0000 mL | Freq: Once | INTRAVENOUS | Status: DC

## 2021-01-25 NOTE — Progress Notes (Signed)
Pt in recovery with monitors in place, VSS. Report given to receiving RN. Bite guard was placed with pt awake to ensure comfort. No dental or soft tissue damage noted.

## 2021-01-25 NOTE — Op Note (Signed)
[place] Patient Name: Mark Glenn Procedure Date: 01/25/2021 9:59 AM MRN: [id number] Endoscopist: Justice Britain , MD Age: 35 Referring MD:  Date of Birth: 09-06-1985 Gender: Male Account #: [id number] Procedure:                Colonoscopy Indications:              Epigastric abdominal pain, Chronic diarrhea Medicines:                Monitored Anesthesia Care Procedure:                Pre-Anesthesia Assessment:                           - Prior to the procedure, a History and Physical                            was performed, and patient medications and                            allergies were reviewed. The patient's tolerance of                            previous anesthesia was also reviewed. The risks                            and benefits of the procedure and the sedation                            options and risks were discussed with the patient.                            All questions were answered, and informed consent                            was obtained. Prior Anticoagulants: The patient has                            taken no previous anticoagulant or antiplatelet                            agents except for aspirin. ASA Grade Assessment: II                            - A patient with mild systemic disease. After                            reviewing the risks and benefits, the patient was                            deemed in satisfactory condition to undergo the                            procedure.  After obtaining informed consent, the colonoscope                            was passed under direct vision. Throughout the                            procedure, the patient's blood pressure, pulse, and                            oxygen saturations were monitored continuously. The                            CF [id number] #[id number] was introduced through the anus                            and advanced to the 5 cm into the ileum. The                             colonoscopy was performed without difficulty. The                            patient tolerated the procedure. The quality of the                            bowel preparation was good. The terminal ileum,                            ileocecal valve, appendiceal orifice, and rectum                            were photographed. Scope In: 10:21:56 AM Scope Out: 10:33:14 AM Scope Withdrawal Time: 0 hours 9 minutes 43 seconds  Total Procedure Duration: 0 hours 11 minutes 18 seconds  Findings:                 The digital rectal exam findings include                            hemorrhoids. Pertinent negatives include no                            palpable rectal lesions.                           The terminal ileum and ileocecal valve appeared                            normal.                           A 5 mm polyp was found in the recto-sigmoid colon.                            The polyp was sessile. The polyp was removed with a  cold snare. Resection and retrieval were complete.                           Localized very mild mucosal changes characterized                            by erosions were found at the hepatic flexure.                           The rest of the colon had normal mucosa throughout.                            Biopsies were taken with a cold forceps for                            histology from the right colon. Biopsies were taken                            with a cold forceps for histology from the left                            colon.                           Non-bleeding non-thrombosed external and internal                            hemorrhoids were found during retroflexion, during                            perianal exam and during digital exam. The                            hemorrhoids were Grade II (internal hemorrhoids                            that prolapse but reduce spontaneously). Complications:             No immediate complications. Estimated Blood Loss:     Estimated blood loss was minimal. Impression:               - Hemorrhoids found on digital rectal exam.                           - The examined portion of the ileum was normal.                           - One 5 mm polyp at the recto-sigmoid colon,                            removed with a cold snare. Resected and retrieved.                           - Localized mild mucosal erosion changes were found  at the hepatic flexure. Otherwise a completely                            normal endoscopically appearing colon was noted.                            Biopsied the Right/Left colon.                           - Non-bleeding non-thrombosed external and internal                            hemorrhoids. Recommendation:           - The patient will be observed post-procedure,                            until all discharge criteria are met.                           - Discharge patient to home.                           - Patient has a contact number available for                            emergencies. The signs and symptoms of potential                            delayed complications were discussed with the                            patient. Return to normal activities tomorrow.                            Written discharge instructions were provided to the                            patient.                           - High fiber diet.                           - Use Benefiber or Metamucil 1-2 times daily for                            bulking purposes.                           - Continue present medications otherwise.                           - Consideration of Fecal elastase testing/SIBO                            evaluation/ IBS-D treatment with Rifaximin in  future pending final pathology and rule out of                            microscopic colitis.                           -  Await pathology results.                           - Repeat colonoscopy in 5/710 years for                            surveillance based on pathology results and                            findings of adenomatous tissue. If evidence of                            chronic colitis is found may consider earlier                            colonoscopy.                           - The findings and recommendations were discussed                            with the patient. Justice Britain, MD 01/25/2021 10:46:10 AM

## 2021-01-25 NOTE — Progress Notes (Signed)
GASTROENTEROLOGY PROCEDURE H&P NOTE   Primary Care Physician: [facility]  HPI: Mark Glenn is a 35 y.o. male who presents for EGD/Colonoscopy for evaluation of epigastric abdominal pain with nausea and vomiting and alteration of bowel habits (greater than 3 years).  Past Medical History:  Diagnosis Date   Hypertension    Kidney calculus    Past Surgical History:  Procedure Laterality Date   ANKLE SURGERY Right    MOUTH SURGERY     due to root canal/crown/abscess   Current Outpatient Medications  Medication Sig Dispense Refill   amLODipine (NORVASC) 10 MG tablet Take 1 tablet (10 mg total) by mouth daily. 30 tablet 5   aspirin EC 81 MG tablet Take 1 tablet (81 mg total) by mouth daily with breakfast. Swallow whole. 30 tablet 11   Garlic 5053 MG CAPS Take 1 capsule by mouth daily.     hydrALAZINE (APRESOLINE) 50 MG tablet Take 1 tablet (50 mg total) by mouth 2 (two) times daily. 60 tablet 5   lisinopril-hydrochlorothiazide (ZESTORETIC) 20-12.5 MG tablet Take 1 tablet by mouth daily. 30 tablet 5   Multiple Vitamin (ONE-A-DAY MENS PO) Take 1 tablet by mouth daily.     omeprazole (PRILOSEC) 40 MG capsule Take 1 capsule (40 mg total) by mouth daily. 30 capsule 5   acetaminophen (TYLENOL) 325 MG tablet Take 2 tablets (650 mg total) by mouth every 6 (six) hours as needed for mild pain (or Fever >/= 101). 30 tablet 3   HYDROcodone-acetaminophen (NORCO/VICODIN) 5-325 MG tablet Take 1 tablet by mouth every 6 (six) hours as needed for severe pain. 15 tablet 0   ondansetron (ZOFRAN) 4 MG tablet Take 1 tablet (4 mg total) by mouth every 6 (six) hours. 12 tablet 0   potassium chloride (KLOR-CON) 10 MEQ tablet Take 1 tablet (10 mEq total) by mouth daily. Take While taking HCTZ 30 tablet 2   Current Facility-Administered Medications  Medication Dose Route Frequency Provider Last Rate Last Admin   0.9 %  sodium chloride infusion  500 mL Intravenous Once Mansouraty, Telford Nab., MD        Current Outpatient Medications:    amLODipine (NORVASC) 10 MG tablet, Take 1 tablet (10 mg total) by mouth daily., Disp: 30 tablet, Rfl: 5   aspirin EC 81 MG tablet, Take 1 tablet (81 mg total) by mouth daily with breakfast. Swallow whole., Disp: 30 tablet, Rfl: 11   Garlic 9767 MG CAPS, Take 1 capsule by mouth daily., Disp: , Rfl:    hydrALAZINE (APRESOLINE) 50 MG tablet, Take 1 tablet (50 mg total) by mouth 2 (two) times daily., Disp: 60 tablet, Rfl: 5   lisinopril-hydrochlorothiazide (ZESTORETIC) 20-12.5 MG tablet, Take 1 tablet by mouth daily., Disp: 30 tablet, Rfl: 5   Multiple Vitamin (ONE-A-DAY MENS PO), Take 1 tablet by mouth daily., Disp: , Rfl:    omeprazole (PRILOSEC) 40 MG capsule, Take 1 capsule (40 mg total) by mouth daily., Disp: 30 capsule, Rfl: 5   acetaminophen (TYLENOL) 325 MG tablet, Take 2 tablets (650 mg total) by mouth every 6 (six) hours as needed for mild pain (or Fever >/= 101)., Disp: 30 tablet, Rfl: 3   HYDROcodone-acetaminophen (NORCO/VICODIN) 5-325 MG tablet, Take 1 tablet by mouth every 6 (six) hours as needed for severe pain., Disp: 15 tablet, Rfl: 0   ondansetron (ZOFRAN) 4 MG tablet, Take 1 tablet (4 mg total) by mouth every 6 (six) hours., Disp: 12 tablet, Rfl: 0   potassium  chloride (KLOR-CON) 10 MEQ tablet, Take 1 tablet (10 mEq total) by mouth daily. Take While taking HCTZ, Disp: 30 tablet, Rfl: 2  Current Facility-Administered Medications:    0.9 %  sodium chloride infusion, 500 mL, Intravenous, Once, Mansouraty, Telford Nab., MD No Known Allergies Family History  Problem Relation Age of Onset   Clotting disorder Mother    Heart failure Father    Hypertension Paternal Grandfather    Social History   Socioeconomic History   Marital status: Married    Spouse name: Not on file   Number of children: 4   Years of education: Not on file   Highest education level: Not on file  Occupational History   Occupation: Sales  Tobacco Use    Smoking status: Former    Packs/day: 0.50    Types: Cigarettes    Quit date: 12/20/2020    Years since quitting: 0.0   Smokeless tobacco: Never  Vaping Use   Vaping Use: Every day  Substance and Sexual Activity   Alcohol use: Not Currently   Drug use: Never   Sexual activity: Not on file  Other Topics Concern   Not on file  Social History Narrative   Not on file   Social Determinants of Health   Financial Resource Strain: Not on file  Food Insecurity: Not on file  Transportation Needs: Not on file  Physical Activity: Not on file  Stress: Not on file  Social Connections: Not on file  Intimate Partner Violence: Not on file    Physical Exam: Today's Vitals   01/25/21 0937 01/25/21 0944  BP: (!) 121/57   Pulse: (!) 59   Temp:  98.6 F (37 C)  SpO2: 99%   Weight: 231 lb (104.8 kg)   Height: 5' 9"  (1.753 m)    Body mass index is 34.11 kg/m. GEN: NAD EYE: Sclerae anicteric ENT: MMM CV: Non-tachycardic GI: Soft, NT/ND NEURO:  Alert & Oriented x 3  Lab Results: No results for input(s): WBC, HGB, HCT, PLT in the last 72 hours. BMET No results for input(s): NA, K, CL, CO2, GLUCOSE, BUN, CREATININE, CALCIUM in the last 72 hours. LFT No results for input(s): PROT, ALBUMIN, AST, ALT, ALKPHOS, BILITOT, BILIDIR, IBILI in the last 72 hours. PT/INR No results for input(s): LABPROT, INR in the last 72 hours.   Impression / Plan: This is a 35 y.o.male who presents for EGD/Colonoscopy for evaluation of epigastric abdominal pain with nausea and vomiting and alteration of bowel habits (greater than 3 years).  The risks and benefits of endoscopic evaluation/treatment were discussed with the patient and/or family; these include but are not limited to the risk of perforation, infection, bleeding, missed lesions, lack of diagnosis, severe illness requiring hospitalization, as well as anesthesia and sedation related illnesses.  The patient's history has been reviewed, patient  examined, no change in status, and deemed stable for procedure.  The patient and/or family is agreeable to proceed.    Justice Britain, MD [facility] Office # [phone]

## 2021-01-25 NOTE — Op Note (Signed)
[facility] Patient Name: Mark Glenn Procedure Date: 01/25/2021 9:58 AM MRN: [id number] Endoscopist: Justice Britain , MD Age: 35 Referring MD:  Date of Birth: May 04, 1985 Gender: Male Account #: [id number] Procedure:                Upper GI endoscopy Indications:              Epigastric abdominal pain, Nausea with vomiting Medicines:                Monitored Anesthesia Care Procedure:                Pre-Anesthesia Assessment:                           - Prior to the procedure, a History and Physical                            was performed, and patient medications and                            allergies were reviewed. The patient's tolerance of                            previous anesthesia was also reviewed. The risks                            and benefits of the procedure and the sedation                            options and risks were discussed with the patient.                            All questions were answered, and informed consent                            was obtained. Prior Anticoagulants: The patient has                            taken no previous anticoagulant or antiplatelet                            agents except for aspirin. ASA Grade Assessment: II                            - A patient with mild systemic disease. After                            reviewing the risks and benefits, the patient was                            deemed in satisfactory condition to undergo the                            procedure.  After obtaining informed consent, the endoscope was                            passed under direct vision. Throughout the                            procedure, the patient's blood pressure, pulse, and                            oxygen saturations were monitored continuously. The                            Endoscope was introduced through the mouth, and                            advanced to the second part of  duodenum. The upper                            GI endoscopy was accomplished without difficulty.                            The patient tolerated the procedure. Scope In: Scope Out: Findings:                 No gross lesions were noted in the proximal                            esophagus and in the mid esophagus.                           Scattered islands of salmon-colored mucosa were                            present from 39 to 40 cm. No other visible                            abnormalities were present. Biopsies were taken                            with a cold forceps for histology.                           The Z-line was irregular and was found 40 cm from                            the incisors.                           A 2 cm hiatal hernia was present.                           One non-bleeding cratered gastric ulcer with a                            clean ulcer base (  Forrest Class III) was found in                            the gastric antrum. The lesion was 10 mm in largest                            dimension.                           Patchy mildly erythematous mucosa without bleeding                            was found in the entire examined stomach. Biopsies                            were taken with a cold forceps for histology and                            Helicobacter pylori testing.                           Patchy mildly erythematous mucosa without active                            bleeding and with no stigmata of bleeding was found                            in the duodenal bulb, in the first portion of the                            duodenum and in the second portion of the duodenum.                            Biopsies were taken with a cold forceps for                            histology. Complications:            No immediate complications. Estimated Blood Loss:     Estimated blood loss was minimal. Impression:               - No gross lesions in esophagus  proximally.                            Salmon-colored mucosal islands suspicious for                            Barrett's esophagus distally - biopsied.                           - Z-line irregular, 40 cm from the incisors.                           - 2 cm hiatal hernia.                           -  Non-bleeding gastric ulcer with a clean ulcer                            base (Forrest Class III) in antrum. Erythematous                            mucosa in the stomach. Biopsied.                           - Erythematous duodenopathy. Biopsied. Recommendation:           - Proceed to scheduled colonoscopy.                           - Observe patient's clinical course.                           - Await pathology results.                           - Increase to Omeprazole 40 mg twice daily.                           - Repeat upper endoscopy in 4 months to check                            healing.                           - The findings and recommendations were discussed                            with the patient. Justice Britain, MD 01/25/2021 10:40:02 AM

## 2021-01-25 NOTE — Progress Notes (Signed)
Pt's states no medical or surgical changes since previsit or office visit. 

## 2021-01-25 NOTE — Progress Notes (Signed)
C.W. vital signs. 

## 2021-01-25 NOTE — Progress Notes (Signed)
Called to room to assist during endoscopic procedure.  Patient ID and intended procedure confirmed with present staff. Received instructions for my participation in the procedure from the performing physician.  

## 2021-01-25 NOTE — Patient Instructions (Signed)
Please read handouts provided. Continue present medications. Await pathology results. Increase Omeprazole to 40 mg twice daily. Repeat upper endoscopy in 4 months to check healing. High Fiber Diet. Use Benefiber or Metamucil 1-2 times daily for bulking purposes.   YOU HAD AN ENDOSCOPIC PROCEDURE TODAY AT [facility]:   Refer to the procedure report that was given to you for any specific questions about what was found during the examination.  If the procedure report does not answer your questions, please call your gastroenterologist to clarify.  If you requested that your care partner not be given the details of your procedure findings, then the procedure report has been included in a sealed envelope for you to review at your convenience later.  YOU SHOULD EXPECT: Some feelings of bloating in the abdomen. Passage of more gas than usual.  Walking can help get rid of the air that was put into your GI tract during the procedure and reduce the bloating. If you had a lower endoscopy (such as a colonoscopy or flexible sigmoidoscopy) you may notice spotting of blood in your stool or on the toilet paper. If you underwent a bowel prep for your procedure, you may not have a normal bowel movement for a few days.  Please Note:  You might notice some irritation and congestion in your nose or some drainage.  This is from the oxygen used during your procedure.  There is no need for concern and it should clear up in a day or so.  SYMPTOMS TO REPORT IMMEDIATELY:  Following lower endoscopy (colonoscopy or flexible sigmoidoscopy):  Excessive amounts of blood in the stool  Significant tenderness or worsening of abdominal pains  Swelling of the abdomen that is new, acute  Fever of 100F or higher  Following upper endoscopy (EGD)  Vomiting of blood or coffee ground material  New chest pain or pain under the shoulder blades  Painful or persistently difficult swallowing  New shortness of  breath  Fever of 100F or higher  Black, tarry-looking stools  For urgent or emergent issues, a gastroenterologist can be reached at any hour by calling [phone]. Do not use MyChart messaging for urgent concerns.    DIET:  We do recommend a small meal at first, but then you may proceed to your regular diet.  Drink plenty of fluids but you should avoid alcoholic beverages for 24 hours.  ACTIVITY:  You should plan to take it easy for the rest of today and you should NOT DRIVE or use heavy machinery until tomorrow (because of the sedation medicines used during the test).    FOLLOW UP: Our staff will call the number listed on your records 48-72 hours following your procedure to check on you and address any questions or concerns that you may have regarding the information given to you following your procedure. If we do not reach you, we will leave a message.  We will attempt to reach you two times.  During this call, we will ask if you have developed any symptoms of COVID 19. If you develop any symptoms (ie: fever, flu-like symptoms, shortness of breath, cough etc.) before then, please call [phone].  If you test positive for Covid 19 in the 2 weeks post procedure, please call and report this information to [place].    If any biopsies were taken you will be contacted by phone or by letter within the next 1-3 weeks.  Please call us at [phone] if you have not heard about  the biopsies in 3 weeks.    SIGNATURES/CONFIDENTIALITY: You and/or your care partner have signed paperwork which will be entered into your electronic medical record.  These signatures attest to the fact that that the information above on your After Visit Summary has been reviewed and is understood.  Full responsibility of the confidentiality of this discharge information lies with you and/or your care-partner.

## 2021-01-27 ENCOUNTER — Telehealth: Payer: Self-pay

## 2021-01-27 ENCOUNTER — Telehealth: Payer: Self-pay | Admitting: *Deleted

## 2021-01-27 NOTE — Telephone Encounter (Signed)
  Follow up Call-  Call back number 01/25/2021  Post procedure Call Back phone  # [phone]  Permission to leave phone message Yes     Patient questions:  Do you have a fever, pain , or abdominal swelling? No. Pain Score  0 *  Have you tolerated food without any problems? Yes.    Have you been able to return to your normal activities? Yes.    Do you have any questions about your discharge instructions: Diet   No. Medications  No. Follow up visit  No.  Do you have questions or concerns about your Care? No.  Actions: * If pain score is 4 or above: No action needed, pain <4.

## 2021-01-27 NOTE — Telephone Encounter (Signed)
Attempted to call patient for their post-procedure follow-up call. No answer. Left voicemail.   

## 2021-01-30 ENCOUNTER — Encounter: Payer: Self-pay | Admitting: Gastroenterology

## 2021-02-07 ENCOUNTER — Emergency Department (HOSPITAL_COMMUNITY)
Admission: EM | Admit: 2021-02-07 | Discharge: 2021-02-07 | Disposition: A | Discharge status: Home / Self Care | Payer: Medicaid Other | Attending: Emergency Medicine | Admitting: Emergency Medicine

## 2021-02-07 ENCOUNTER — Encounter (HOSPITAL_COMMUNITY): Payer: Self-pay | Admitting: *Deleted

## 2021-02-07 DIAGNOSIS — I110 Hypertensive heart disease with heart failure: Secondary | ICD-10-CM | POA: Insufficient documentation

## 2021-02-07 DIAGNOSIS — I5032 Chronic diastolic (congestive) heart failure: Secondary | ICD-10-CM | POA: Insufficient documentation

## 2021-02-07 DIAGNOSIS — U071 COVID-19: Secondary | ICD-10-CM | POA: Insufficient documentation

## 2021-02-07 DIAGNOSIS — Z79899 Other long term (current) drug therapy: Secondary | ICD-10-CM | POA: Insufficient documentation

## 2021-02-07 DIAGNOSIS — J029 Acute pharyngitis, unspecified: Secondary | ICD-10-CM | POA: Diagnosis present

## 2021-02-07 DIAGNOSIS — Z7982 Long term (current) use of aspirin: Secondary | ICD-10-CM | POA: Diagnosis not present

## 2021-02-07 DIAGNOSIS — Z87891 Personal history of nicotine dependence: Secondary | ICD-10-CM | POA: Insufficient documentation

## 2021-02-07 LAB — RESP PANEL BY RT-PCR (FLU A&B, COVID) ARPGX2
Influenza A by PCR: NEGATIVE
Influenza B by PCR: NEGATIVE
SARS Coronavirus 2 by RT PCR: POSITIVE — AB

## 2021-02-07 LAB — GROUP A STREP BY PCR: Group A Strep by PCR: NOT DETECTED

## 2021-02-07 MED ORDER — BENZONATATE 100 MG PO CAPS: 100.0000 mg | ORAL_CAPSULE | Freq: Three times a day (TID) | ORAL | 0 refills | Status: DC

## 2021-02-07 MED ORDER — LIDOCAINE VISCOUS HCL 2 % MT SOLN
15.0000 mL | Freq: Once | OROMUCOSAL | Status: AC
  Administered 2021-02-07: 16:00:00 15 mL via OROMUCOSAL
  Filled 2021-02-07: qty 15

## 2021-02-07 NOTE — ED Provider Notes (Signed)
[facility] EMERGENCY DEPARTMENT Provider Note   CSN: [id number] Arrival date & time: 02/07/21  1315     History Chief Complaint  Patient presents with   Sore Throat    Mark Glenn is a 35 y.o. male.   Sore Throat Pertinent negatives include no chest pain, no headaches and no shortness of breath.   Patient presents with sore throat and nasal congestion x1 day.  There is an associated cough and fever but no vomiting or diarrhea.  Patient is COVID vaccinated and BC x1.  Does not for the flu vaccine anymore.  Not feeling short of breath or having any chest pain.  Has not tried any over-the-counter medicine.  No obvious aggravating factors.  Symptom started acutely, sore throat is constant.  Past Medical History:  Diagnosis Date   Hypertension    Kidney calculus     Patient Active Problem List   Diagnosis Date Noted   Abdominal pain, epigastric 01/17/2021   Nausea and vomiting 01/17/2021   Diarrhea 01/17/2021   Rectal bleeding 01/17/2021   Chronic heart failure with preserved ejection fraction (HFpEF) /chronic diastolic dysfunction CHF 23/76/2831   HTN (hypertension) 09/17/2020   Obesity (BMI 30.0-34.9) 09/17/2020   Hypertensive urgency 09/16/2020   Nicotine dependence 09/16/2020   Hypokalemia 09/16/2020   Chest pain 09/16/2020    Past Surgical History:  Procedure Laterality Date   ANKLE SURGERY Right    MOUTH SURGERY     due to root canal/crown/abscess       Family History  Problem Relation Age of Onset   Clotting disorder Mother    Heart failure Father    Hypertension Paternal Grandfather     Social History   Tobacco Use   Smoking status: Former    Packs/day: 0.50    Types: Cigarettes    Quit date: 12/20/2020    Years since quitting: 0.1   Smokeless tobacco: Never  Vaping Use   Vaping Use: Every day  Substance Use Topics   Alcohol use: Not Currently   Drug use: Never    Home Medications Prior to Admission medications   Medication Sig Start  Date End Date Taking? Authorizing Provider  acetaminophen (TYLENOL) 325 MG tablet Take 2 tablets (650 mg total) by mouth every 6 (six) hours as needed for mild pain (or Fever >/= 101). 09/17/20   Roxan Hockey, MD  amLODipine (NORVASC) 10 MG tablet Take 1 tablet (10 mg total) by mouth daily. 09/18/20   Roxan Hockey, MD  aspirin EC 81 MG tablet Take 1 tablet (81 mg total) by mouth daily with breakfast. Swallow whole. 09/17/20   Roxan Hockey, MD  Garlic 5176 MG CAPS Take 1 capsule by mouth daily.    [provider]  hydrALAZINE (APRESOLINE) 50 MG tablet Take 1 tablet (50 mg total) by mouth 2 (two) times daily. 09/17/20   Roxan Hockey, MD  HYDROcodone-acetaminophen (NORCO/VICODIN) 5-325 MG tablet Take 1 tablet by mouth every 6 (six) hours as needed for severe pain. 01/13/21   Evalee Jefferson, PA-C  lisinopril-hydrochlorothiazide (ZESTORETIC) 20-12.5 MG tablet Take 1 tablet by mouth daily. 09/17/20   Roxan Hockey, MD  Multiple Vitamin (ONE-A-DAY MENS PO) Take 1 tablet by mouth daily.    [provider]  omeprazole (PRILOSEC) 40 MG capsule Take 1 capsule (40 mg total) by mouth 2 (two) times daily. 01/25/21   Mansouraty, Telford Nab., MD  ondansetron (ZOFRAN) 4 MG tablet Take 1 tablet (4 mg total) by mouth every 6 (six) hours. 01/13/21  Evalee Jefferson, PA-C  potassium chloride (KLOR-CON) 10 MEQ tablet Take 1 tablet (10 mEq total) by mouth daily. Take While taking HCTZ 09/17/20   Roxan Hockey, MD    Allergies    Patient has no known allergies.  Review of Systems   Review of Systems  Constitutional:  Positive for fever.  HENT:  Positive for congestion and sore throat.   Respiratory:  Negative for shortness of breath.   Cardiovascular:  Negative for chest pain.  Neurological:  Negative for headaches.   Physical Exam Updated Vital Signs BP (!) 141/82 (BP Location: Right Arm)    Pulse 71    Temp 99 F (37.2 C) (Oral)    Resp 18    Wt 104.3 kg    SpO2 98%    BMI 33.97 kg/m    Physical Exam Vitals and nursing note reviewed. Exam conducted with a chaperone present.  Constitutional:      General: He is not in acute distress.    Appearance: Normal appearance.  HENT:     Head: Normocephalic and atraumatic.     Nose: Congestion present.     Mouth/Throat:     Pharynx: Posterior oropharyngeal erythema present.  Eyes:     General: No scleral icterus.    Extraocular Movements: Extraocular movements intact.     Pupils: Pupils are equal, round, and reactive to light.  Cardiovascular:     Rate and Rhythm: Normal rate and regular rhythm.  Pulmonary:     Effort: Pulmonary effort is normal.     Breath sounds: Normal breath sounds.     Comments: Lungs CTA bilaterally. No accessory muscle use. Speaking in complete sentences.  Abdominal:     General: Abdomen is flat.     Palpations: Abdomen is soft.  Musculoskeletal:     Cervical back: Normal range of motion.  Skin:    Coloration: Skin is not jaundiced.  Neurological:     Mental Status: He is alert. Mental status is at baseline.     Coordination: Coordination normal.  Psychiatric:        Mood and Affect: Mood normal.   ED Results / Procedures / Treatments   Labs (all labs ordered are listed, but only abnormal results are displayed) Labs Reviewed  GROUP A STREP BY PCR  RESP PANEL BY RT-PCR (FLU A&B, COVID) [id number]    EKG None  Radiology No results found.  Procedures Procedures   Medications Ordered in ED Medications - No data to display  ED Course  I have reviewed the triage vital signs and the nursing notes.  Pertinent labs & imaging results that were available during my care of the patient were reviewed by me and considered in my medical decision making (see chart for details).    MDM Rules/Calculators/A&P                         Stable vitals, patient is nontoxic.  Lungs are clear to auscultation bilaterally.  No hypoxia or tachypnea, patient is positive for COVID.  Do not think any  additional work-up needed at this time, he is also strep negative.  Patient discharged in stable condition.     Final Clinical Impression(s) / ED Diagnoses Final diagnoses:  None    Rx / DC Orders ED Discharge Orders     None        Sherrill Raring, [place] 02/07/21 1818    Lajean Saver, MD 02/07/21 1932

## 2021-02-07 NOTE — Discharge Instructions (Addendum)
You have covid. To help yourself recover, drink plenty of fluids and get plenty of rest.   For fever, HA, sore throat or body aches - take tylenol (500-100 mg) every 8 hours. Do not exceed 3000mg /day. You ca also take ibuprofen.   For cough, take tessalon perles every 8 hours as needed. You can also use cough drops or drink honey.  Return if things worsen or change.

## 2021-02-07 NOTE — ED Triage Notes (Signed)
Nasal congestion with sore throat

## 2021-02-20 ENCOUNTER — Other Ambulatory Visit: Payer: Self-pay

## 2021-02-20 ENCOUNTER — Encounter (HOSPITAL_COMMUNITY): Payer: Self-pay

## 2021-02-20 ENCOUNTER — Emergency Department (HOSPITAL_COMMUNITY)
Admission: EM | Admit: 2021-02-20 | Discharge: 2021-02-20 | Disposition: A | Discharge status: Home / Self Care | Payer: Medicaid Other | Attending: Emergency Medicine | Admitting: Emergency Medicine

## 2021-02-20 DIAGNOSIS — Z7901 Long term (current) use of anticoagulants: Secondary | ICD-10-CM | POA: Diagnosis not present

## 2021-02-20 DIAGNOSIS — M7989 Other specified soft tissue disorders: Secondary | ICD-10-CM | POA: Diagnosis not present

## 2021-02-20 DIAGNOSIS — M79605 Pain in left leg: Secondary | ICD-10-CM

## 2021-02-20 DIAGNOSIS — M79662 Pain in left lower leg: Secondary | ICD-10-CM | POA: Diagnosis present

## 2021-02-20 DIAGNOSIS — I824Y2 Acute embolism and thrombosis of unspecified deep veins of left proximal lower extremity: Secondary | ICD-10-CM

## 2021-02-20 DIAGNOSIS — Z79899 Other long term (current) drug therapy: Secondary | ICD-10-CM | POA: Diagnosis not present

## 2021-02-20 MED ORDER — HYDROCODONE-ACETAMINOPHEN 5-325 MG PO TABS: 1.0000 | ORAL_TABLET | Freq: Four times a day (QID) | ORAL | 0 refills | Status: DC | PRN

## 2021-02-20 MED ORDER — APIXABAN 5 MG PO TABS
5.0000 mg | ORAL_TABLET | Freq: Once | ORAL | Status: DC
Start: 2021-02-20 — End: 2021-02-20

## 2021-02-20 MED ORDER — APIXABAN 5 MG PO TABS: ORAL_TABLET | ORAL | 0 refills | Status: DC

## 2021-02-20 MED ORDER — APIXABAN 5 MG PO TABS
10.0000 mg | ORAL_TABLET | Freq: Once | ORAL | Status: AC
  Administered 2021-02-20: 10 mg via ORAL
  Filled 2021-02-20: qty 2

## 2021-02-20 MED ORDER — HYDROCODONE-ACETAMINOPHEN 5-325 MG PO TABS
1.0000 | ORAL_TABLET | Freq: Once | ORAL | Status: AC
  Administered 2021-02-20: 1 via ORAL
  Filled 2021-02-20: qty 1

## 2021-02-20 NOTE — ED Provider Notes (Signed)
[facility] EMERGENCY DEPARTMENT Provider Note   CSN: [id number] Arrival date & time: 02/20/21  0720     History  Chief Complaint  Patient presents with   Leg Pain    Mark Glenn is a 36 y.o. male.  Patient is a 36 yo male presenting for leg swelling. Pt admits to left leg swelling x two days. Admits to pain in leg from knee down.  Denies falls, injuries, or wounds. Denies color changes. Admits to multiple family members with blood clotting disorder-unknown dx.   The history is provided by the patient. No language interpreter was used.  Leg Pain Associated symptoms: no back pain and no fever       Home Medications Prior to Admission medications   Medication Sig Start Date End Date Taking? Authorizing Provider  apixaban (ELIQUIS) 5 MG TABS tablet Take 2 tablets (10mg ) twice daily for 7 days, then 1 tablet (5mg ) twice daily 08/26/39  Yes Campbell Stall P, DO  HYDROcodone-acetaminophen (NORCO) 5-325 MG tablet Take 1 tablet by mouth every 6 (six) hours as needed for moderate pain. 03/29/76  Yes Campbell Stall P, DO  acetaminophen (TYLENOL) 325 MG tablet Take 2 tablets (650 mg total) by mouth every 6 (six) hours as needed for mild pain (or Fever >/= 101). 09/17/20   Roxan Hockey, MD  amLODipine (NORVASC) 10 MG tablet Take 1 tablet (10 mg total) by mouth daily. 09/18/20   Roxan Hockey, MD  benzonatate (TESSALON) 100 MG capsule Take 1 capsule (100 mg total) by mouth every 8 (eight) hours. 02/07/21   Sherrill Raring, PA-C  Garlic 6767 MG CAPS Take 1 capsule by mouth daily.    [provider]  hydrALAZINE (APRESOLINE) 50 MG tablet Take 1 tablet (50 mg total) by mouth 2 (two) times daily. 09/17/20   Roxan Hockey, MD  HYDROcodone-acetaminophen (NORCO/VICODIN) 5-325 MG tablet Take 1 tablet by mouth every 6 (six) hours as needed for severe pain. 01/13/21   Evalee Jefferson, PA-C  lisinopril-hydrochlorothiazide (ZESTORETIC) 20-12.5 MG tablet Take 1 tablet by mouth daily. 09/17/20   Roxan Hockey, MD  Multiple Vitamin (ONE-A-DAY MENS PO) Take 1 tablet by mouth daily.    [provider]  omeprazole (PRILOSEC) 40 MG capsule Take 1 capsule (40 mg total) by mouth 2 (two) times daily. 01/25/21   Mansouraty, Telford Nab., MD  ondansetron (ZOFRAN) 4 MG tablet Take 1 tablet (4 mg total) by mouth every 6 (six) hours. 01/13/21   Evalee Jefferson, PA-C  potassium chloride (KLOR-CON) 10 MEQ tablet Take 1 tablet (10 mEq total) by mouth daily. Take While taking HCTZ 09/17/20   Roxan Hockey, MD      Allergies    Patient has no known allergies.    Review of Systems   Review of Systems  Constitutional:  Negative for chills and fever.  HENT:  Negative for ear pain and sore throat.   Eyes:  Negative for pain and visual disturbance.  Respiratory:  Negative for cough and shortness of breath.   Cardiovascular:  Positive for leg swelling. Negative for chest pain and palpitations.  Gastrointestinal:  Negative for abdominal pain and vomiting.  Genitourinary:  Negative for dysuria and hematuria.  Musculoskeletal:  Negative for arthralgias and back pain.  Skin:  Negative for color change and rash.  Neurological:  Negative for seizures and syncope.  All other systems reviewed and are negative.  Physical Exam Updated Vital Signs BP 138/74    Pulse 75    Temp 98.7 F (37.1 C) (  Oral)    Resp 17    Ht 5\' 9"  (1.753 m)    Wt 104.3 kg    SpO2 99%    BMI 33.96 kg/m  Physical Exam Vitals and nursing note reviewed.  Constitutional:      Appearance: Normal appearance.  HENT:     Head: Normocephalic and atraumatic.  Cardiovascular:     Rate and Rhythm: Normal rate and regular rhythm.     Pulses:          Dorsalis pedis pulses are 2+ on the right side and 2+ on the left side.  Pulmonary:     Effort: Pulmonary effort is normal.     Breath sounds: Normal breath sounds.  Musculoskeletal:        General: Swelling present. No tenderness, deformity or signs of injury.     Comments: Left leg  swelling from knee down. +2 pitting edema left leg.   Skin:    General: Skin is warm.     Capillary Refill: Capillary refill takes less than 2 seconds.     Coloration: Skin is not ashen, cyanotic or mottled.  Neurological:     General: No focal deficit present.     Mental Status: He is alert and oriented to person, place, and time.     Sensory: Sensation is intact.     Motor: Motor function is intact.    ED Results / Procedures / Treatments   Labs (all labs ordered are listed, but only abnormal results are displayed) Labs Reviewed - No data to display  EKG None  Radiology No results found.  Procedures Procedures    Medications Ordered in ED Medications  apixaban (ELIQUIS) tablet 5 mg (has no administration in time range)  HYDROcodone-acetaminophen (NORCO/VICODIN) 5-325 MG per tablet 1 tablet (has no administration in time range)    ED Course/ Medical Decision Making/ A&P                           Medical Decision Making  8:01 AM  36 yo male presenting for leg swelling without injury. Family hx of unknown clotting disorder. Pt Aox3, no acute distress, afebrile, with stable vitals. Physical exam demonstrates +2 pitting edema of left leg. Leg soft. No compartment syndrome. Leg neurovascularly intact. Normal arterial pulse.   Concern for DVT. Ultrasound currently not available at [facility]. Outpatient [place] for tomorrow ordered. Pt started on Eliquis. No hx of GI or intracranial bleeds. Risks vs benefits of starting blood thinner discussed in detail. Recommendations for follow up with heme specialist for reported family hx of blood clotting disorder, multiple clots, and multiple deaths.   Patient in no distress and overall condition improved here in the ED. Detailed discussions were had with the patient regarding current findings, and need for close f/u with PCP or on call doctor. The patient has been instructed to return immediately if the symptoms worsen in any way for  re-evaluation. Patient verbalized understanding and is in agreement with current care plan. All questions answered prior to discharge.    Final Clinical Impression(s) / ED Diagnoses Final diagnoses:  Left leg swelling  Left leg pain  Concern for acute deep vein thrombosis (DVT)     Rx / DC Orders ED Discharge Orders          Ordered    US Venous Img Lower Unilateral Left        02/20/21 0744    apixaban (ELIQUIS) 5 MG  TABS tablet        02/20/21 0744    HYDROcodone-acetaminophen (NORCO) 5-325 MG tablet  Every 6 hours PRN        02/20/21 0744              Lianne Cure, DO 27/51/70 0174

## 2021-02-20 NOTE — Discharge Instructions (Signed)
Return tomorrow for ultrasound for concerns of DVT-Deep Vein Thrombosis.  Take Eliquis (blood thinner) until [place] results back. Then follow up with vascular physician in 5-7 days.   Follow up with hematology (blood specialist) for family hx of blood clotting disorders.

## 2021-02-20 NOTE — ED Triage Notes (Signed)
Patient complaining of left leg pain from knee down. Pain started two days ago. Noted with swelling. Recent dx of Covid.

## 2021-02-21 ENCOUNTER — Ambulatory Visit (HOSPITAL_COMMUNITY)
Admission: RE | Admit: 2021-02-21 | Discharge: 2021-02-21 | Disposition: A | Discharge status: Home / Self Care | Payer: Medicaid Other | Source: Ambulatory Visit | Attending: Emergency Medicine | Admitting: Emergency Medicine

## 2021-02-21 DIAGNOSIS — M79605 Pain in left leg: Secondary | ICD-10-CM | POA: Diagnosis not present

## 2021-02-21 NOTE — ED Provider Notes (Signed)
° °  Patient seen here yesterday for swelling and pain of his left lower leg.  Family history of clotting disorders.  Patient denies history of prior PE or DVT.  Outpatient venous imaging of the left lower extremity was ordered no chest pain or shortness of breath.  Patient was started on Eliquis yesterday.  He returns today for previously scheduled venous imaging of the left lower extremity.  Ultrasound shows nonocclusive thrombus in the left popliteal vein.  I have discussed these findings with the patient.  I have recommended elevating the affected extremity and application of warm compresses.  He has PCP follow-up, I recommended repeat venous imaging in 1 week.  Patient verbalized understanding and agrees to plan.  Return precautions were discussed.  US Venous Img Lower Unilateral Left (DVT)  Result Date: 02/21/2021 CLINICAL DATA:  Pain in left lower extremity EXAM: LEFT LOWER EXTREMITY VENOUS DOPPLER ULTRASOUND TECHNIQUE: Gray-scale sonography with compression, as well as color and duplex ultrasound, were performed to evaluate the deep venous system(s) from the level of the common femoral vein through the popliteal and proximal calf veins. COMPARISON:  None. FINDINGS: VENOUS Thrombus in the left popliteal vein, which appears nonocclusive. Normal compressibility of the common femoral, and superficial femoral, as well as the visualized calf veins. Visualized portions of profunda femoral vein and great saphenous vein unremarkable. No filling defects to suggest DVT on grayscale or color Doppler imaging. Doppler waveforms show normal direction of venous flow, normal respiratory plasticity and response to augmentation. Limited views of the contralateral common femoral vein are unremarkable. OTHER None. 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 Electronically Signed   By: Merilyn Baba M.D.   On: 02/21/2021 12:12            Kem Parkinson, PA-C 02/21/21 1241     Hayden Rasmussen, MD 02/21/21 [id number]

## 2021-02-28 ENCOUNTER — Other Ambulatory Visit: Payer: Self-pay | Admitting: Physician Assistant

## 2021-02-28 ENCOUNTER — Other Ambulatory Visit (HOSPITAL_COMMUNITY): Payer: Self-pay | Admitting: Physician Assistant

## 2021-02-28 DIAGNOSIS — I82402 Acute embolism and thrombosis of unspecified deep veins of left lower extremity: Secondary | ICD-10-CM

## 2021-03-06 ENCOUNTER — Other Ambulatory Visit: Payer: Self-pay

## 2021-03-06 ENCOUNTER — Ambulatory Visit (HOSPITAL_COMMUNITY)
Admission: RE | Admit: 2021-03-06 | Discharge: 2021-03-06 | Disposition: A | Discharge status: Home / Self Care | Payer: Medicaid Other | Source: Ambulatory Visit | Attending: Physician Assistant | Admitting: Physician Assistant

## 2021-03-06 DIAGNOSIS — I82402 Acute embolism and thrombosis of unspecified deep veins of left lower extremity: Secondary | ICD-10-CM | POA: Diagnosis present

## 2021-03-15 ENCOUNTER — Encounter: Payer: Self-pay | Admitting: Gastroenterology

## 2021-03-15 ENCOUNTER — Ambulatory Visit: Payer: Medicaid Other | Admitting: Gastroenterology

## 2021-03-15 ENCOUNTER — Other Ambulatory Visit (INDEPENDENT_AMBULATORY_CARE_PROVIDER_SITE_OTHER): Payer: Medicaid Other

## 2021-03-15 VITALS — BP 128/84 | HR 65 | Ht 69.0 in | Wt 249.0 lb

## 2021-03-15 DIAGNOSIS — R109 Unspecified abdominal pain: Secondary | ICD-10-CM

## 2021-03-15 DIAGNOSIS — K529 Noninfective gastroenteritis and colitis, unspecified: Secondary | ICD-10-CM | POA: Diagnosis not present

## 2021-03-15 DIAGNOSIS — K259 Gastric ulcer, unspecified as acute or chronic, without hemorrhage or perforation: Secondary | ICD-10-CM

## 2021-03-15 DIAGNOSIS — R1013 Epigastric pain: Secondary | ICD-10-CM | POA: Diagnosis not present

## 2021-03-15 DIAGNOSIS — K51919 Ulcerative colitis, unspecified with unspecified complications: Secondary | ICD-10-CM | POA: Diagnosis not present

## 2021-03-15 DIAGNOSIS — K625 Hemorrhage of anus and rectum: Secondary | ICD-10-CM

## 2021-03-15 DIAGNOSIS — Z7901 Long term (current) use of anticoagulants: Secondary | ICD-10-CM

## 2021-03-15 LAB — CBC
HCT: 41.2 % (ref 39.0–52.0)
Hemoglobin: 14.1 g/dL (ref 13.0–17.0)
MCHC: 34.2 g/dL (ref 30.0–36.0)
MCV: 87.9 fl (ref 78.0–100.0)
Platelets: 312 10*3/uL (ref 150.0–400.0)
RBC: 4.69 Mil/uL (ref 4.22–5.81)
RDW: 13.6 % (ref 11.5–15.5)
WBC: 7.4 10*3/uL (ref 4.0–10.5)

## 2021-03-15 LAB — COMPREHENSIVE METABOLIC PANEL
ALT: 32 U/L (ref 0–53)
AST: 21 U/L (ref 0–37)
Albumin: 4.8 g/dL (ref 3.5–5.2)
Alkaline Phosphatase: 42 U/L (ref 39–117)
BUN: 14 mg/dL (ref 6–23)
CO2: 29 mEq/L (ref 19–32)
Calcium: 9.6 mg/dL (ref 8.4–10.5)
Chloride: 101 mEq/L (ref 96–112)
Creatinine, Ser: 0.96 mg/dL (ref 0.40–1.50)
GFR: 104.09 mL/min (ref >60.00)
Glucose, Bld: 85 mg/dL (ref 70–99)
Potassium: 4.1 mEq/L (ref 3.5–5.1)
Sodium: 137 mEq/L (ref 135–145)
Total Bilirubin: 0.7 mg/dL (ref 0.2–1.2)
Total Protein: 8 g/dL (ref 6.0–8.3)

## 2021-03-15 LAB — C-REACTIVE PROTEIN: CRP: <1 mg/dL (ref 0.5–20.0)

## 2021-03-15 LAB — SEDIMENTATION RATE: Sed Rate: 17 mm/hr — ABNORMAL HIGH (ref 0–15)

## 2021-03-15 MED ORDER — MESALAMINE 1.2 G PO TBEC: 4.8000 g | DELAYED_RELEASE_TABLET | Freq: Every day | ORAL | 3 refills | Status: DC

## 2021-03-15 NOTE — Patient Instructions (Addendum)
We have sent the following medications to your pharmacy for you to pick up at your convenience: [place] provider has requested that you go to the basement level for lab work before leaving today. Press "B" on the elevator. The lab is located at the first door on the left as you exit the elevator.  Increase Fiber to twice daily.   Keep follow up on : 04/20/21 at 3:30pm  Will discuss scheduling EGD at follow-up to evaluate healing of Gastric Ulcers.    If you are age 34 or older, your body mass index should be between 23-30. Your Body mass index is 36.77 kg/m. If this is out of the aforementioned range listed, please consider follow up with your Primary Care Provider.  If you are age 68 or younger, your body mass index should be between 19-25. Your Body mass index is 36.77 kg/m. If this is out of the aformentioned range listed, please consider follow up with your Primary Care Provider.   ________________________________________________________  The [place] [facility] providers would like to encourage you to use [facility] to communicate with providers for non-urgent requests or questions.  Due to long hold times on the telephone, sending your provider a message by [facility] may be a faster and more efficient way to get a response.  Please allow 48 business hours for a response.  Please remember that this is for non-urgent requests.  _______________________________________________________  Due to recent changes in healthcare laws, you may see the results of your imaging and laboratory studies on MyChart before your provider has had a chance to review them.  We understand that in some cases there may be results that are confusing or concerning to you. Not all laboratory results come back in the same time frame and the provider may be waiting for multiple results in order to interpret others.  Please give [place] 48 hours in order for your provider to thoroughly review all the results before contacting the office  for clarification of your results.   Thank you for choosing me and [facility].  Dr. Rush Landmark

## 2021-03-16 ENCOUNTER — Encounter: Payer: Self-pay | Admitting: Gastroenterology

## 2021-03-16 DIAGNOSIS — K529 Noninfective gastroenteritis and colitis, unspecified: Secondary | ICD-10-CM | POA: Insufficient documentation

## 2021-03-16 DIAGNOSIS — Z7901 Long term (current) use of anticoagulants: Secondary | ICD-10-CM | POA: Insufficient documentation

## 2021-03-16 DIAGNOSIS — R109 Unspecified abdominal pain: Secondary | ICD-10-CM | POA: Insufficient documentation

## 2021-03-16 DIAGNOSIS — K259 Gastric ulcer, unspecified as acute or chronic, without hemorrhage or perforation: Secondary | ICD-10-CM | POA: Insufficient documentation

## 2021-03-16 NOTE — Progress Notes (Signed)
[place] VISIT   Primary Care Provider Health, [place] [facility] [address] [place] [address] [phone]   Patient Profile: Mark Glenn is a 36 y.o. 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  The patient presents to the [facility] for an evaluation and management of problem(s) noted below:  Problem List 1. Chronic colitis   2. Multiple gastric ulcers   3. Epigastric pain   4. Rectal bleeding   5. On continuous oral anticoagulation     History of Present Illness Please see prior GI notes for full details of HPI.  Interval History The patient returns for a scheduled follow-up.  We performed an upper and lower endoscopy in December and found that he had evidence of chronic colitis throughout his colon.  Overt diagnosis of ulcerative colitis was not defined by just the pathology.  However with some of the alteration of his fiber intake in diet he has had some improvement in some of his symptoms.  He is having anywhere between 2 and 4 bowel movements per day.  Sometimes the bowel movements are semiformed and other times they are completely watery and loose.  But again prior to his fiber and diet changes this was much worse.  The patient has been taking his PPI twice daily to help with the gastric ulcers that were found and he has had a significant improvement in his abdominal pain and discomfort.  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  He does describe awakening during his sleeping hours to have a bowel movement at least 3-4 times per week.  He is noticing rectal bleeding as well that occurs a few times per week but just a few days ago he had rectal bleeding with each of his bowel movements.  He was recently initiated on anticoagulation due to a  left lower extremity DVT that was found.  He is planning to be seen by hematology in the coming weeks to rule out any sort of anticoagulation disorder as he has a family history of such.  The patient denies any issues of tenesmus.  He does not have incomplete evacuation issues.  He is not taking significant nonsteroidals or BC/[name].  GI Review of Systems Positive as above Negative for dysphagia, odynophagia, nausea, vomiting, melena  Review of Systems General: Denies fevers/chills/weight loss unintentionally HEENT: Denies oral lesions Cardiovascular: Denies chest pain Pulmonary: Denies shortness of breath/cough Gastroenterological: See HPI Genitourinary: Denies darkened urine Hematological: Positive for recent history of easy bruising/bleeding due to anticoagulation Dermatological: Denies jaundice Psychological: Mood is stable   Medications Current Outpatient Medications  Medication Sig Dispense Refill   acetaminophen (TYLENOL) 325 MG tablet Take 2 tablets (650 mg total) by mouth every 6 (six) hours as needed for mild pain (or Fever >/= 101). 30 tablet 3   amLODipine (NORVASC) 10 MG tablet Take 1 tablet (10 mg total) by mouth daily. 30 tablet 5   apixaban (ELIQUIS) 5 MG TABS tablet Take 2 tablets (43m) twice daily for 7 days, then 1 tablet (581m twice daily 60 tablet 0   benzonatate (TESSALON) 100 MG capsule Take 1 capsule (100 mg total) by mouth every 8 (eight) hours. 21 capsule 0   Garlic 102162G CAPS Take 1 capsule by mouth daily.     hydrALAZINE (APRESOLINE) 50 MG tablet Take 1 tablet (50  mg total) by mouth 2 (two) times daily. 60 tablet 5   HYDROcodone-acetaminophen (NORCO) 5-325 MG tablet Take 1 tablet by mouth every 6 (six) hours as needed for moderate pain. 12 tablet 0   HYDROcodone-acetaminophen (NORCO/VICODIN) 5-325 MG tablet Take 1 tablet by mouth every 6 (six) hours as needed for severe pain. 15 tablet 0   lisinopril-hydrochlorothiazide (ZESTORETIC) 20-12.5 MG  tablet Take 1 tablet by mouth daily. 30 tablet 5   mesalamine (LIALDA) 1.2 g EC tablet Take 4 tablets (4.8 g total) by mouth daily with breakfast. 60 tablet 3   Multiple Vitamin (ONE-A-DAY MENS PO) Take 1 tablet by mouth daily.     omeprazole (PRILOSEC) 40 MG capsule Take 1 capsule (40 mg total) by mouth 2 (two) times daily. 60 capsule 5   ondansetron (ZOFRAN) 4 MG tablet Take 1 tablet (4 mg total) by mouth every 6 (six) hours. 12 tablet 0   potassium chloride (KLOR-CON) 10 MEQ tablet Take 1 tablet (10 mEq total) by mouth daily. Take While taking HCTZ 30 tablet 2   No current facility-administered medications for this visit.    Allergies No Known Allergies  Histories Past Medical History:  Diagnosis Date   Hypertension    Kidney calculus    Past Surgical History:  Procedure Laterality Date   ANKLE SURGERY Right    MOUTH SURGERY     due to root canal/crown/abscess   Social History   Socioeconomic History   Marital status: Married    Spouse name: Not on file   Number of children: 4   Years of education: Not on file   Highest education level: Not on file  Occupational History   Occupation: Sales  Tobacco Use   Smoking status: Former    Packs/day: 0.50    Types: Cigarettes    Quit date: 12/20/2020    Years since quitting: 0.2   Smokeless tobacco: Never  Vaping Use   Vaping Use: Every day  Substance and Sexual Activity   Alcohol use: Not Currently   Drug use: Never   Sexual activity: Not on file  Other Topics Concern   Not on file  Social History Narrative   Not on file   Social Determinants of Health   Financial Resource Strain: Not on file  Food Insecurity: Not on file  Transportation Needs: Not on file  Physical Activity: Not on file  Stress: Not on file  Social Connections: Not on file  Intimate Partner Violence: Not on file   Family History  Problem Relation Age of Onset   Clotting disorder Mother    Heart failure Father    Clotting disorder Brother         x 2   Clotting disorder Maternal Grandmother    Hypertension Paternal Grandfather    Colon cancer Neg Hx    Stomach cancer Neg Hx    Esophageal cancer Neg Hx    Pancreatic cancer Neg Hx    Inflammatory bowel disease Neg Hx    Liver disease Neg Hx    Rectal cancer Neg Hx    I have reviewed his medical, social, and family history in detail and updated the electronic medical record as necessary.    PHYSICAL EXAMINATION  BP 128/84    Pulse 65    Ht 5' 9"  (1.753 m)    Wt 249 lb (112.9 kg)    SpO2 98%    BMI 36.77 kg/m  Wt Readings from Last 3 Encounters:  03/15/21 249 lb (112.9  kg)  02/20/21 229 lb 15 oz (104.3 kg)  02/07/21 230 lb (104.3 kg)   GEN: NAD, appears stated age, doesn't appear chronically ill PSYCH: Cooperative, without pressured speech EYE: Conjunctivae pink, sclerae anicteric ENT: MMM, without oral ulcers, no erythema or exudates noted NECK: Supple CV: RR without R/Gs  RESP: CTAB posteriorly, without wheezing GI: NABS, soft, NT/ND, without rebound or guarding, no HSM appreciated GU: DRE shows MSK/EXT: _ edema, no palmar erythema SKIN: No jaundice, no spider angiomata, no concerning rashes NEURO:  Alert & Oriented x 3, no focal deficits, no evidence of asterixis   REVIEW OF DATA  I reviewed the following data at the time of this encounter:  GI Procedures and Studies  December 2022 EGD - No gross lesions in esophagus proximally. Salmon-colored mucosal islands suspicious for Barrett's esophagus distally - biopsied. - Z-line irregular, 40 cm from the incisors. - 2 cm hiatal hernia. - Non-bleeding gastric ulcer with a clean ulcer base (Forrest Class III) in antrum. Erythematous mucosa in the stomach. Biopsied. - Erythematous duodenopathy. Biopsied.  December 2022 colonoscopy - Hemorrhoids found on digital rectal exam. - The examined portion of the ileum was normal. - One 5 mm polyp at the recto-sigmoid colon, removed with a cold snare. Resected and  retrieved. - Localized mild mucosal erosion changes were found at the hepatic flexure. Otherwise a completely normal endoscopically appearing colon was noted. Biopsied the Right/Left colon. - Non-bleeding non-thrombosed external and internal hemorrhoids.  Pathology Diagnosis 1. Surgical [P], random gastric sites CHRONIC GASTRITIS. H. PYLORI AND INTESTINAL METAPLASIA ARE NOT IDENTIFIED. NEGATIVE FOR DYSPLASIA AND MALIGNANCY. 2. 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 THERE ARE NO DIAGNOSTIC FEATURES OF CELIAC DISEASE. 3. Surgical [P], distal esophagus NONSPECIFIC INFLAMMATION WITHOUT GOBLET CELL METAPLASIA IN FRAGMENTS OF GASTRIC MUCOSA. NONSPECIFIC INFLAMMATION IN THE ADJACENT PART OF ESOPHAGEAL MUCOSA. THERE ARE NO DIAGNOSTIC FEATURES OF BARRETT'S ESOPHAGUS AND EOSINOPHILIC ESOPHAGITIS. NEGATIVE FOR DYSPLASIA AND MALIGNANCY. 4. Surgical [P], random right colon sites CHRONIC COLITIS WITH PREDOMINANCE OF EOSINOPHILS AND A SMALL CRYPT ABSCESS. PLEASE SEE COMMENT AFTER SPECIMEN #4. 5. Surgical [P], random left colon sites MILD CHRONIC COLITIS WITHOUT CRYPT DISTORTION. PLEASE SEE COMMENT. COMMENT: THE PATIENT'S CLINICAL HISTORY AND ENDOSCOPIC FINDINGS ARE REVIEWED. THE PATTERN OF INFLAMMATION IN THE BIOPSIES OF THE COLON ARE NOT DIAGNOSTIC OF INFLAMMATORY BOWEL DISEASE MICROSCOPIC COLITIS AND COLLAGENOUS COLITIS. INFLAMMATION IN THE GASTRIC BIOPSY AND THE DUODENUM HAS PREDOMINANCE OF PLASMA CELLS. POSSIBILITY OF DRUG-INDUCED CHANGES INCLUDING NSAID INDUCED CHANGES SHOULD BE CONSIDERED. PREDOMINANCE PLASMA CELLS IN THE INFLAMMATORY COMPONENT BRINGS THE POSSIBILITY OF AN IMMUNE RELATED PHENOMENA. THESE FINDINGS SHOULD BE CORRELATED WITH CLINICAL PICTURE TO DETERMINE THE POSSIBLE ETIOLOGY OF THE INFLAMMATORY PROCESS IN THE UPPER AND LOWER GI TRACT. 6. Surgical [P], colon, recto-sigmoid, polyp (1) HYPERPLASTIC POLYP. NEGATIVE FOR  DYSPLASIA.  Laboratory Studies  Reviewed those in epic and care everywhere  Imaging Studies  No new studies to review   ASSESSMENT  Mr. Fults is a 36 y.o. 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  The patient is seen today for evaluation and management of:  1. Chronic colitis   2. Multiple gastric ulcers   3. Epigastric pain   4. Rectal bleeding   5. On continuous oral anticoagulation    The patient is hemodynamically stable at this time.  Clinically, he has had some improvement  with just dietary and fiber intake.  With that being said, his findings on his recent colonoscopy of nonspecific chronic colitis do make me concerned that his symptoms are really more a result of inflammatory bowel disease (ulcerative colitis).  The patient is not taking any other medications that would increase his chance for the development of chronic colitis at this time.  I am going to initiate the patient on mesalamine therapy and we will monitor and see how he does.  If he has persisting issues on mesalamine after a few months, then we will need to think about immunomodulators therapy and biologic therapy.  Due to the nonspecific chronic colitis nature, we may even need to repeat a colonoscopy this year.  The patient is in agreement with this plan of action.  We have seen a significant improvement in his symptoms by being on PPI therapy for his gastric ulcer disease.  We will plan a repeat endoscopy in March of this year (approximately 4 months from his initial endoscopy) to ensure healing of his gastric ulcer.  At that point if we can see that he has documented healing we can start decreasing his PPI dosing.  He will increase his fiber a bit more since it made some improvement in his symptoms.  Try to use it twice a day and see if that makes any  difference in his symptoms as well as the mesalamine therapy.  I am going to get his other laboratories ready in case immunomodulators therapy or biologic therapy were to be required in the future.  It is not clear to me the underlying etiology for his left lower extremity DVT, though we know that patients who may have IBD are at an increased risk for blood clots, I do want not want to say that this is the absolute cause of that but we should keep this in the back of our minds.  All patient questions were answered to the best of my ability, and the patient agrees to the aforementioned plan of action with follow-up as indicated.   PLAN  Laboratories as outlined below in case immunomodulator therapy or biologic therapy is required in future Fecal calprotectin to be obtained at patient's convenience Initiate Lialda 4.8 g daily (take half twice a day or all at once) Continue fiber supplementation and increase to twice daily Continue healthy eating habits as you are doing Surveillance EGD in March 2023 Potential repeat colonoscopy later this year in 2023 We will see patient back in February or early March and then work on scheduling EGD at that time to see how he is done on mesalamine therapy Will consider hemorrhoidal therapy versus suppository therapy for proctitis in future pending those symptoms and how they are affected by mesalamine or not   Orders Placed This Encounter  Procedures   Calprotectin, Fecal   CBC   Comp Met (CMET)   Sedimentation rate   C-reactive protein   Hepatitis A antibody, total   Hepatitis B Surface AntiGEN   Hepatitis B Surface AntiBODY   Hepatitis B Core Antibody, total   Hepatitis C Antibody   Thiopurine methyltransferase(tpmt)rbc   QuantiFERON-TB Gold Plus    New Prescriptions   MESALAMINE (LIALDA) 1.2 G EC TABLET    Take 4 tablets (4.8 g total) by mouth daily with breakfast.   Modified Medications   No medications on file    Planned Follow Up No  follow-ups on file.   Total Time in Face-to-Face and in Coordination of Care for  patient including independent/personal interpretation/review of prior testing, medical history, examination, medication adjustment, communicating results with the patient directly, and documentation within the EHR is 25 minutes.  Justice Britain, MD [facility] Office # [phone]

## 2021-03-17 NOTE — Progress Notes (Signed)
[place], [place] [address]   CLINIC:  Medical Oncology/Hematology  CONSULT NOTE  Patient Care Team: Health, [facility] as PCP - General  CHIEF COMPLAINTS/PURPOSE OF CONSULTATION:  Left leg DVT  HISTORY OF PRESENTING ILLNESS:  Mark Glenn 36 y.o. male is here at the request of his primary care provider [facility], PA-C) for evaluation of DVT with family history of clotting disorders.  Mark Glenn had an ED visit on 02/20/2021 with complaints of left lower leg pain and swelling x2 days.  Venous duplex ultrasound (02/21/2021) showed nonocclusive thrombus in the left popliteal vein.  Repeat venous duplex ultrasound (03/06/2021) showed resolution of left lower extremity DVT.    He denies any provoking events such as long flights, recent surgeries, car rides, injuries, or prolonged immobility.  The patient did test positive for COVID-19 on 02/07/2021, but he reports that his symptoms were mild.  He also sees GI for chronic colitis which is suspected to possibly be IBD.  This was his first incidence of blood clots, and he denies any previous PE or DVT.  However, he does have a strong family history of blood clots.  Patient's maternal grandmother, maternal uncles x2, and mother all had DVTs.  He states that his mother and maternal uncles were tested and "all had the same blood clot disorder," but he is not sure what this is.  He was started on Eliquis on 02/20/2021.  He reports that he is compliant with Eliquis as prescribed.  He has some ongoing GI bleeding (daily maroon-colored stools), for which he is being treated by gastroenterology for possible IBD.  He reports that his rectal bleeding "is pretty much the same" as it was prior to starting Eliquis.  He has not noticed any other bleeding such as epistaxis, hematuria, hematemesis, or melena.    He is left leg pain and swelling has improved after starting Eilquis, but he does still have some left  leg pain and dependent edema if he stands for too long.  He previously had issues with chest pain, but this resolved after he was started on antihypertensives.  He has some ongoing dyspnea on exertion for the past 2 months.  No cough, hemoptysis, and palpitations.  He reports 40% energy and 100% appetite.   MEDICAL HISTORY:  Past Medical History:  Diagnosis Date   Hypertension    Kidney calculus     SURGICAL HISTORY: Past Surgical History:  Procedure Laterality Date   ANKLE SURGERY Right    MOUTH SURGERY     due to root canal/crown/abscess    SOCIAL HISTORY: Social History   Socioeconomic History   Marital status: Married    Spouse name: Not on file   Number of children: 4   Years of education: Not on file   Highest education level: Not on file  Occupational History   Occupation: Sales  Tobacco Use   Smoking status: Former    Packs/day: 0.50    Types: Cigarettes    Quit date: 12/20/2020    Years since quitting: 0.2   Smokeless tobacco: Never  Vaping Use   Vaping Use: Every day  Substance and Sexual Activity   Alcohol use: Not Currently   Drug use: Never   Sexual activity: Not on file  Other Topics Concern   Not on file  Social History Narrative   Not on file   Social Determinants of Health   Financial Resource Strain: Not on file  Food Insecurity: Not on file  Transportation Needs: Not on file  Physical Activity: Not on file  Stress: Not on file  Social Connections: Not on file  Intimate Partner Violence: Not on file    FAMILY HISTORY: Family History  Problem Relation Age of Onset   Clotting disorder Mother    Heart failure Father    Clotting disorder Brother        x 2   Clotting disorder Maternal Grandmother    Hypertension Paternal Grandfather    Colon cancer Neg Hx    Stomach cancer Neg Hx    Esophageal cancer Neg Hx    Pancreatic cancer Neg Hx    Inflammatory bowel disease Neg Hx    Liver disease Neg Hx    Rectal cancer Neg Hx      ALLERGIES:  has No Known Allergies.  MEDICATIONS:  Current Outpatient Medications  Medication Sig Dispense Refill   acetaminophen (TYLENOL) 325 MG tablet Take 2 tablets (650 mg total) by mouth every 6 (six) hours as needed for mild pain (or Fever >/= 101). 30 tablet 3   amLODipine (NORVASC) 10 MG tablet Take 1 tablet (10 mg total) by mouth daily. 30 tablet 5   apixaban (ELIQUIS) 5 MG TABS tablet Take 2 tablets (54m) twice daily for 7 days, then 1 tablet (525m twice daily 60 tablet 0   benzonatate (TESSALON) 100 MG capsule Take 1 capsule (100 mg total) by mouth every 8 (eight) hours. 21 capsule 0   Garlic 105638G CAPS Take 1 capsule by mouth daily.     hydrALAZINE (APRESOLINE) 50 MG tablet Take 1 tablet (50 mg total) by mouth 2 (two) times daily. 60 tablet 5   HYDROcodone-acetaminophen (NORCO) 5-325 MG tablet Take 1 tablet by mouth every 6 (six) hours as needed for moderate pain. 12 tablet 0   HYDROcodone-acetaminophen (NORCO/VICODIN) 5-325 MG tablet Take 1 tablet by mouth every 6 (six) hours as needed for severe pain. 15 tablet 0   lisinopril-hydrochlorothiazide (ZESTORETIC) 20-12.5 MG tablet Take 1 tablet by mouth daily. 30 tablet 5   mesalamine (LIALDA) 1.2 g EC tablet Take 4 tablets (4.8 g total) by mouth daily with breakfast. 60 tablet 3   Multiple Vitamin (ONE-A-DAY MENS PO) Take 1 tablet by mouth daily.     omeprazole (PRILOSEC) 40 MG capsule Take 1 capsule (40 mg total) by mouth 2 (two) times daily. 60 capsule 5   ondansetron (ZOFRAN) 4 MG tablet Take 1 tablet (4 mg total) by mouth every 6 (six) hours. 12 tablet 0   potassium chloride (KLOR-CON) 10 MEQ tablet Take 1 tablet (10 mEq total) by mouth daily. Take While taking HCTZ 30 tablet 2   No current facility-administered medications for this visit.    REVIEW OF SYSTEMS:   Review of Systems  Constitutional:  Positive for fatigue. Negative for appetite change, chills, diaphoresis, fever and unexpected weight change.  HENT:    Negative for lump/mass and nosebleeds.   Eyes:  Negative for eye problems.  Respiratory:  Positive for cough (Occasional) and shortness of breath (Occasionally, with exertion). Negative for hemoptysis.   Cardiovascular:  Positive for chest pain (Occasional) and leg swelling (Left leg). Negative for palpitations.  Gastrointestinal:  Negative for abdominal pain, blood in stool, constipation, diarrhea, nausea and vomiting.  Genitourinary:  Negative for hematuria.   Skin: Negative.   Neurological:  Positive for headaches and numbness. Negative for dizziness and light-headedness.  Hematological:  Does not bruise/bleed easily.  Psychiatric/Behavioral:  Positive  for sleep disturbance.      PHYSICAL EXAMINATION: ECOG PERFORMANCE STATUS: 0 - Asymptomatic  There were no vitals filed for this visit. There were no vitals filed for this visit.  Physical Exam Constitutional:      Appearance: Normal appearance. He is obese.  HENT:     Head: Normocephalic and atraumatic.     Mouth/Throat:     Mouth: Mucous membranes are moist.  Eyes:     Extraocular Movements: Extraocular movements intact.     Pupils: Pupils are equal, round, and reactive to light.  Cardiovascular:     Rate and Rhythm: Normal rate and regular rhythm.     Pulses: Normal pulses.     Heart sounds: Normal heart sounds.  Pulmonary:     Effort: Pulmonary effort is normal.     Breath sounds: Normal breath sounds.  Abdominal:     General: Bowel sounds are normal.     Palpations: Abdomen is soft.     Tenderness: There is no abdominal tenderness.  Musculoskeletal:        General: No swelling.     Right lower leg: No edema.     Left lower leg: No edema.  Lymphadenopathy:     Cervical: No cervical adenopathy.  Skin:    General: Skin is warm and dry.  Neurological:     General: No focal deficit present.     Mental Status: He is alert and oriented to person, place, and time.  Psychiatric:        Mood and Affect: Mood normal.         Behavior: Behavior normal.      LABORATORY DATA:  I have reviewed the data as listed Recent Results (from the past 2160 hour(s))  CBC with Differential/Platelet     Status: None   Collection Time: 01/13/21  3:37 PM  Result Value Ref Range   WBC 6.7 4.0 - 10.5 K/uL   RBC 4.27 4.22 - 5.81 MIL/uL   Hemoglobin 13.5 13.0 - 17.0 g/dL   HCT 39.6 39.0 - 52.0 %   MCV 92.7 80.0 - 100.0 fL   MCH 31.6 26.0 - 34.0 pg   MCHC 34.1 30.0 - 36.0 g/dL   RDW 12.8 11.5 - 15.5 %   Platelets 303 150 - 400 K/uL   nRBC 0.0 0.0 - 0.2 %   Neutrophils Relative % 56 %   Neutro Abs 3.8 1.7 - 7.7 K/uL   Lymphocytes Relative 30 %   Lymphs Abs 2.0 0.7 - 4.0 K/uL   Monocytes Relative 7 %   Monocytes Absolute 0.5 0.1 - 1.0 K/uL   Eosinophils Relative 6 %   Eosinophils Absolute 0.4 0.0 - 0.5 K/uL   Basophils Relative 1 %   Basophils Absolute 0.1 0.0 - 0.1 K/uL   Immature Granulocytes 0 %   Abs Immature Granulocytes 0.02 0.00 - 0.07 K/uL    Comment: Performed at [facility], [address] [place], [place] [address]  Comprehensive metabolic panel     Status: None   Collection Time: 01/13/21  3:37 PM  Result Value Ref Range   Sodium 139 135 - 145 mmol/L   Potassium 3.8 3.5 - 5.1 mmol/L   Chloride 106 98 - 111 mmol/L   CO2 25 22 - 32 mmol/L   Glucose, Bld 93 70 - 99 mg/dL    Comment: Glucose reference range applies only to samples taken after fasting for at least 8 hours.   BUN 16 6 - 20  mg/dL   Creatinine, Ser 0.98 0.61 - 1.24 mg/dL   Calcium 8.9 8.9 - 10.3 mg/dL   Total Protein 7.1 6.5 - 8.1 g/dL   Albumin 4.1 3.5 - 5.0 g/dL   AST 22 15 - 41 U/L   ALT 31 0 - 44 U/L   Alkaline Phosphatase 48 38 - 126 U/L   Total Bilirubin 0.6 0.3 - 1.2 mg/dL   GFR, Estimated >60 >60 mL/min    Comment: (NOTE) Calculated using the CKD-EPI Creatinine Equation (2021)    Anion gap 8 5 - 15    Comment: Performed at [facility], [address], [place], [place] [address]  Lipase, blood     Status: None    Collection Time: 01/13/21  3:37 PM  Result Value Ref Range   Lipase 27 11 - 51 U/L    Comment: Performed at [facility], [address], [place], [place] [address]  Urinalysis, Routine w reflex microscopic Urine, Clean Catch     Status: Abnormal   Collection Time: 01/13/21  3:37 PM  Result Value Ref Range   Color, Urine STRAW (A) YELLOW   APPearance CLEAR CLEAR   Specific Gravity, Urine 1.020 1.005 - 1.030   pH 7.0 5.0 - 8.0   Glucose, UA NEGATIVE NEGATIVE mg/dL   Hgb urine dipstick NEGATIVE NEGATIVE   Bilirubin Urine NEGATIVE NEGATIVE   Ketones, ur NEGATIVE NEGATIVE mg/dL   Protein, ur NEGATIVE NEGATIVE mg/dL   Nitrite NEGATIVE NEGATIVE   Leukocytes,Ua NEGATIVE NEGATIVE    Comment: Microscopic not done on urines with negative protein, blood, leukocytes, nitrite, or glucose < 500 mg/dL. Performed at [facility], [address] [place], [place] [address]   Group A Strep by PCR     Status: None   Collection Time: 02/07/21  1:28 PM   Specimen: Throat; Sterile Swab  Result Value Ref Range   Group A Strep by PCR NOT DETECTED NOT DETECTED    Comment: Performed at [facility], [address], [place], [place] [address]  Resp Panel by RT-PCR (Flu A&B, Covid) Throat     Status: Abnormal   Collection Time: 02/07/21  1:28 PM   Specimen: Throat; Nasopharyngeal(NP) swabs in vial transport medium  Result Value Ref Range   SARS Coronavirus 2 by RT PCR POSITIVE (A) NEGATIVE    Comment: (NOTE) SARS-CoV-2 target nucleic acids are DETECTED.  The SARS-CoV-2 RNA is generally detectable in upper respiratory specimens during the acute phase of infection. Positive results are indicative of the presence of the identified virus, but do not rule out bacterial infection or co-infection with other pathogens not detected by the test. Clinical correlation with patient history and other diagnostic information is necessary to determine patient infection status. The expected result is Negative.  Fact Sheet  for Patients: EntrepreneurPulse.com.au  Fact Sheet for Healthcare Providers: IncredibleEmployment.be  This test is not yet approved or cleared by the [place] FDA and  has been authorized for detection and/or diagnosis of SARS-CoV-2 by FDA under an Emergency Use Authorization (EUA).  This EUA will remain in effect (meaning this test can be used) for the duration of  the COVID-19 declaration under Section 564(b)(1) of the A ct, 21 U.S.C. section 360bbb-3(b)(1), unless the authorization is terminated or revoked sooner.     Influenza A by PCR NEGATIVE NEGATIVE   Influenza B by PCR NEGATIVE NEGATIVE    Comment: (NOTE) The Xpert Xpress SARS-CoV-2/FLU/RSV plus assay is intended as an aid in the diagnosis of influenza from  Nasopharyngeal swab specimens and should not be used as a sole basis for treatment. Nasal washings and aspirates are unacceptable for Xpert Xpress SARS-CoV-2/FLU/RSV testing.  Fact Sheet for Patients: EntrepreneurPulse.com.au  Fact Sheet for Healthcare Providers: IncredibleEmployment.be  This test is not yet approved or cleared by the [place] FDA and has been authorized for detection and/or diagnosis of SARS-CoV-2 by FDA under an Emergency Use Authorization (EUA). This EUA will remain in effect (meaning this test can be used) for the duration of the COVID-19 declaration under Section 564(b)(1) of the Act, 21 U.S.C. section 360bbb-3(b)(1), unless the authorization is terminated or revoked.  Performed at [facility], [address], [place], [place] [address]   Hepatitis C Antibody     Status: None   Collection Time: 03/15/21  3:19 PM  Result Value Ref Range   Hepatitis C Ab NON-REACTIVE NON-REACTIVE   SIGNAL TO CUT-OFF <0.02 <1.00    Comment: . HCV antibody was non-reactive. There is no laboratory  evidence of HCV infection. . In most cases, no further action is required.  However, if recent HCV exposure is suspected, a test for HCV RNA (test code [id number]) is suggested. . For additional information please refer to http://education.questdiagnostics.com/faq/FAQ22v1 (This link is being provided for informational/ educational purposes only.) .   Hepatitis B Core Antibody, total     Status: None   Collection Time: 03/15/21  3:19 PM  Result Value Ref Range   Hep B Core Total Ab NON-REACTIVE NON-REACTIVE  Hepatitis B Surface AntiBODY     Status: None   Collection Time: 03/15/21  3:19 PM  Result Value Ref Range   Hep B S Ab NON-REACTIVE NON-REACTIVE  Hepatitis B Surface AntiGEN     Status: None   Collection Time: 03/15/21  3:19 PM  Result Value Ref Range   Hepatitis B Surface Ag NON-REACTIVE NON-REACTIVE  Hepatitis A antibody, total     Status: Abnormal   Collection Time: 03/15/21  3:19 PM  Result Value Ref Range   Hepatitis A AB,Total REACTIVE (A) NON-REACTIVE    Comment: . For additional information, please refer to  http://education.questdiagnostics.com/faq/FAQ202  (This link is being provided for informational/ educational purposes only.) .   C-reactive protein     Status: None   Collection Time: 03/15/21  3:19 PM  Result Value Ref Range   CRP <1.0 0.5 - 20.0 mg/dL  Sedimentation rate     Status: Abnormal   Collection Time: 03/15/21  3:19 PM  Result Value Ref Range   Sed Rate 17 (H) 0 - 15 mm/hr  Comp Met (CMET)     Status: None   Collection Time: 03/15/21  3:19 PM  Result Value Ref Range   Sodium 137 135 - 145 mEq/L   Potassium 4.1 3.5 - 5.1 mEq/L   Chloride 101 96 - 112 mEq/L   CO2 29 19 - 32 mEq/L   Glucose, Bld 85 70 - 99 mg/dL   BUN 14 6 - 23 mg/dL   Creatinine, Ser 0.96 0.40 - 1.50 mg/dL   Total Bilirubin 0.7 0.2 - 1.2 mg/dL   Alkaline Phosphatase 42 39 - 117 U/L   AST 21 0 - 37 U/L   ALT 32 0 - 53 U/L   Total Protein 8.0 6.0 - 8.3 g/dL   Albumin 4.8 3.5 - 5.2 g/dL   GFR 104.09 >60.00 mL/min    Comment: Calculated using the  CKD-EPI Creatinine Equation (2021)   Calcium 9.6 8.4 - 10.5 mg/dL  CBC  Status: None   Collection Time: 03/15/21  3:19 PM  Result Value Ref Range   WBC 7.4 4.0 - 10.5 K/uL   RBC 4.69 4.22 - 5.81 Mil/uL   Platelets 312.0 150.0 - 400.0 K/uL   Hemoglobin 14.1 13.0 - 17.0 g/dL   HCT 41.2 39.0 - 52.0 %   MCV 87.9 78.0 - 100.0 fl   MCHC 34.2 30.0 - 36.0 g/dL   RDW 13.6 11.5 - 15.5 %    RADIOGRAPHIC STUDIES: I have personally reviewed the radiological images as listed and agreed with the findings in the report. US Venous Img Lower Unilateral Left (DVT)  Result Date: 03/06/2021 CLINICAL DATA:  Nonocclusive left popliteal DVT on prior ultrasound EXAM: LEFT LOWER EXTREMITY VENOUS DOPPLER ULTRASOUND TECHNIQUE: Gray-scale sonography with compression, as well as color and duplex ultrasound, were performed to evaluate the deep venous system(s) from the level of the common femoral vein through the popliteal and proximal calf veins. COMPARISON:  02/21/2021 FINDINGS: VENOUS Normal compressibility of the common femoral, superficial femoral, and popliteal veins, as well as the visualized calf veins. Visualized portions of profunda femoral vein and great saphenous vein unremarkable. No filling defects to suggest DVT on grayscale or color Doppler imaging. Doppler waveforms show normal direction of venous flow, normal respiratory plasticity and response to augmentation. The nonocclusive thrombus seen within the left popliteal vein on prior study is no longer identified. Limited views of the contralateral common femoral vein are unremarkable. OTHER None. Limitations: none IMPRESSION: 1. No evidence of deep venous thrombosis within the left lower extremity. The nonocclusive thrombus within the left popliteal vein on prior study has resolved in the interim. Electronically Signed   By: Randa Ngo M.D.   On: 03/06/2021 15:56   US Venous Img Lower Unilateral Left (DVT)  Result Date: 02/21/2021 CLINICAL DATA:  Pain  in left lower extremity EXAM: LEFT LOWER EXTREMITY VENOUS DOPPLER ULTRASOUND TECHNIQUE: Gray-scale sonography with compression, as well as color and duplex ultrasound, were performed to evaluate the deep venous system(s) from the level of the common femoral vein through the popliteal and proximal calf veins. COMPARISON:  None. FINDINGS: VENOUS Thrombus in the left popliteal vein, which appears nonocclusive. Normal compressibility of the common femoral, and superficial femoral, as well as the visualized calf veins. Visualized portions of profunda femoral vein and great saphenous vein unremarkable. No filling defects to suggest DVT on grayscale or color Doppler imaging. Doppler waveforms show normal direction of venous flow, normal respiratory plasticity and response to augmentation. Limited views of the contralateral common femoral vein are unremarkable. OTHER None. 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 Electronically Signed   By: Merilyn Baba M.D.   On: 02/21/2021 12:12    ASSESSMENT & PLAN: 1.  Left lower extremity DVT - ED visit on 02/20/2021 with complaints of left lower leg pain and swelling x2 days - Venous duplex ultrasound (02/21/2021) showed nonocclusive thrombus in the left popliteal vein - Repeat venous duplex ultrasound (03/06/2021) showed resolution of left lower extremity DVT - No clear provoking events. The patient did test positive for COVID-19 on 02/07/2021.  He also sees GI for chronic colitis which is suspected to possibly be IBD. - Strong maternal family history of DVT and unspecified clotting disorder - He was started on Eliquis on 02/20/2021.  He has some ongoing maroon-colored stools, being followed by GI for suspected IBD.   - Intermittent left leg pain and dependent edema if he stands for too long. - PLAN:  Continue Eliquis 5 mg twice daily. - Consider that this may have been a weekly provoked DVT in the setting of recent COVID-19 infection.   However, given the patient's strong family history of coagulopathy, recommend coagulopathy work-up. - Will likely continue Eliquis until IBD is under control.  Depending on other work-up, he may require lifelong anticoagulation. - We will check CBC and iron panel due to his ongoing hematochezia. - Labs today with RTC in 2 weeks to discuss results and recommendations.  2.  Other history - PMH: Chronic colitis (possible IBD), PUD (gastritis and gastric ulcers), CHF, hypertension, nephrolithiasis, obesity  - SOCIAL: Lives at home with his wife and 4 children.  He works at [employer].  He is a former smoker, quit in November 2022.  He occasionally drinks alcohol in social settings.  He denies any current or previous use of illicit drugs. - FAMILY: Strong family history of heart disease (paternal), hypertension (paternal), and coagulopathy (maternal)   PLAN SUMMARY & DISPOSITION: Labs today RTC in 2 to 3 weeks to discuss results and next steps  All questions were answered. The patient knows to call the clinic with any problems, questions or concerns.   Medical decision making: Moderate  Time spent on visit: I spent 25 minutes counseling the patient face to face. The total time spent in the appointment was 40 minutes and more than 50% was on counseling.  I, Tarri Abernethy PA-C, have seen this patient in conjunction with Dr. Derek Jack. Greater than 50% of visit was performed by Dr. Delton Coombes.     Harriett Rush, PA-C 03/20/2021 4:13 PM  DR. : I have independently evaluated this patient and formulated my assessment and plan.  I agree with HPI, assessment plan written by Casey Burkitt, PA-C.  He had left leg popliteal DVT, weakly provoked after positive COVID test 10 days prior.  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  Additionally he has very strong family history with his mother,  paternal grandmother with DVT and positive mutation.  We will do mutation testing.  Based on the results, we may recommend long-term anticoagulation.

## 2021-03-20 ENCOUNTER — Encounter (HOSPITAL_COMMUNITY): Payer: Medicaid Other | Admitting: Physician Assistant

## 2021-03-20 ENCOUNTER — Other Ambulatory Visit: Payer: Self-pay

## 2021-03-20 ENCOUNTER — Encounter (HOSPITAL_COMMUNITY): Payer: Self-pay | Admitting: Hematology

## 2021-03-20 ENCOUNTER — Inpatient Hospital Stay (HOSPITAL_COMMUNITY): Payer: Medicaid Other

## 2021-03-20 ENCOUNTER — Encounter (HOSPITAL_COMMUNITY): Payer: Medicaid Other | Admitting: Hematology

## 2021-03-20 ENCOUNTER — Inpatient Hospital Stay (HOSPITAL_COMMUNITY): Payer: Medicaid Other | Attending: Physician Assistant | Admitting: Hematology

## 2021-03-20 VITALS — BP 151/92 | HR 77 | Temp 98.2°F | Resp 18 | Ht 69.0 in | Wt 249.4 lb

## 2021-03-20 DIAGNOSIS — I509 Heart failure, unspecified: Secondary | ICD-10-CM | POA: Diagnosis not present

## 2021-03-20 DIAGNOSIS — Z86718 Personal history of other venous thrombosis and embolism: Secondary | ICD-10-CM | POA: Diagnosis not present

## 2021-03-20 DIAGNOSIS — Z7901 Long term (current) use of anticoagulants: Secondary | ICD-10-CM

## 2021-03-20 DIAGNOSIS — I110 Hypertensive heart disease with heart failure: Secondary | ICD-10-CM | POA: Insufficient documentation

## 2021-03-20 DIAGNOSIS — Z87891 Personal history of nicotine dependence: Secondary | ICD-10-CM | POA: Insufficient documentation

## 2021-03-20 DIAGNOSIS — Z79899 Other long term (current) drug therapy: Secondary | ICD-10-CM | POA: Diagnosis not present

## 2021-03-20 DIAGNOSIS — K625 Hemorrhage of anus and rectum: Secondary | ICD-10-CM

## 2021-03-20 DIAGNOSIS — I82432 Acute embolism and thrombosis of left popliteal vein: Secondary | ICD-10-CM

## 2021-03-20 DIAGNOSIS — Z8249 Family history of ischemic heart disease and other diseases of the circulatory system: Secondary | ICD-10-CM | POA: Insufficient documentation

## 2021-03-20 LAB — CBC WITH DIFFERENTIAL/PLATELET
Abs Immature Granulocytes: 0.02 10*3/uL (ref 0.00–0.07)
Basophils Absolute: 0.1 10*3/uL (ref 0.0–0.1)
Basophils Relative: 1 %
Eosinophils Absolute: 0.3 10*3/uL (ref 0.0–0.5)
Eosinophils Relative: 4 %
HCT: 40.1 % (ref 39.0–52.0)
Hemoglobin: 13.8 g/dL (ref 13.0–17.0)
Immature Granulocytes: 0 %
Lymphocytes Relative: 32 %
Lymphs Abs: 2.2 10*3/uL (ref 0.7–4.0)
MCH: 30.7 pg (ref 26.0–34.0)
MCHC: 34.4 g/dL (ref 30.0–36.0)
MCV: 89.3 fL (ref 80.0–100.0)
Monocytes Absolute: 0.4 10*3/uL (ref 0.1–1.0)
Monocytes Relative: 6 %
Neutro Abs: 4 10*3/uL (ref 1.7–7.7)
Neutrophils Relative %: 57 %
Platelets: 309 10*3/uL (ref 150–400)
RBC: 4.49 MIL/uL (ref 4.22–5.81)
RDW: 13.1 % (ref 11.5–15.5)
WBC: 6.9 10*3/uL (ref 4.0–10.5)
nRBC: 0 % (ref 0.0–0.2)

## 2021-03-20 LAB — IRON AND TIBC
Iron: 48 ug/dL (ref 45–182)
Saturation Ratios: 11 % — ABNORMAL LOW (ref 17.9–39.5)
TIBC: 432 ug/dL (ref 250–450)
UIBC: 384 ug/dL

## 2021-03-20 LAB — D-DIMER, QUANTITATIVE: D-Dimer, Quant: <0.27 ug/mL-FEU (ref 0.00–0.50)

## 2021-03-20 LAB — FERRITIN: Ferritin: 32 ng/mL (ref 24–336)

## 2021-03-20 NOTE — Patient Instructions (Signed)
[place] at [facility] Discharge Instructions  You were seen today by Dr. Delton Coombes & Tarri Abernethy PA-C for your left leg DVT.    LABS: We will check labs TODAY to evaluate you for possible clotting disorders that could have predisposed you for DVT.  MEDICATIONS: Continue Eliquis 5 mg twice daily  FOLLOW-UP APPOINTMENT: Office visit in 2 to 3 weeks to discuss lab results and next steps.   Thank you for choosing [place] at [facility] to provide your oncology and hematology care.  To afford each patient quality time with our provider, please arrive at least 15 minutes before your scheduled appointment time.   If you have a lab appointment with the [facility] please come in thru the Main Entrance and check in at the main information desk.  You need to re-schedule your appointment should you arrive 10 or more minutes late.  We strive to give you quality time with our providers, and arriving late affects you and other patients whose appointments are after yours.  Also, if you no show three or more times for appointments you may be dismissed from the clinic at the providers discretion.     Again, thank you for choosing [facility]  Our hope is that these requests will decrease the amount of time that you wait before being seen by our physicians.       _____________________________________________________________  Should you have questions after your visit to [facility], please contact our office at [phone] and follow the prompts.  Our office hours are 8:00 a.m. and 4:30 p.m. Monday - Friday.  Please note that voicemails left after 4:00 p.m. may not be returned until the following business day.  We are closed weekends and major holidays.  You do have access to a nurse 24-7, just call the main number to the clinic [phone] and do not press any options, hold on the line and a nurse will answer the phone.     For prescription refill requests, have your pharmacy contact our office and allow 72 hours.    Due to Covid, you will need to wear a mask upon entering the hospital. If you do not have a mask, a mask will be given to you at the Main Entrance upon arrival. For doctor visits, patients may have 1 support person age 34 or older with them. For treatment visits, patients can not have anyone with them due to social distancing guidelines and our immunocompromised population.

## 2021-03-21 LAB — LUPUS ANTICOAGULANT PANEL
DRVVT: 45.4 s (ref 0.0–47.0)
PTT Lupus Anticoagulant: 36.7 s (ref 0.0–51.9)

## 2021-03-22 LAB — QUANTIFERON-TB GOLD PLUS
Mitogen-NIL: >10 IU/mL
NIL: 0.06 IU/mL
QuantiFERON-TB Gold Plus: NEGATIVE
TB1-NIL: <0 IU/mL
TB2-NIL: <0 IU/mL

## 2021-03-22 LAB — HEPATITIS B CORE ANTIBODY, TOTAL: Hep B Core Total Ab: NONREACTIVE

## 2021-03-22 LAB — BETA-2-GLYCOPROTEIN I ABS, IGG/M/A
Beta-2 Glyco I IgG: <9 GPI IgG units (ref 0–20)
Beta-2-Glycoprotein I IgA: <9 GPI IgA units (ref 0–25)
Beta-2-Glycoprotein I IgM: <9 GPI IgM units (ref 0–32)

## 2021-03-22 LAB — HEPATITIS A ANTIBODY, TOTAL: Hepatitis A AB,Total: REACTIVE — AB

## 2021-03-22 LAB — HEPATITIS B SURFACE ANTIGEN: Hepatitis B Surface Ag: NONREACTIVE

## 2021-03-22 LAB — THIOPURINE METHYLTRANSFERASE (TPMT), RBC: Thiopurine Methyltransferase, RBC: 18 nmol/hr/mL RBC

## 2021-03-22 LAB — HEPATITIS C ANTIBODY
Hepatitis C Ab: NONREACTIVE
SIGNAL TO CUT-OFF: <0.02 (ref <1.00)

## 2021-03-22 LAB — CARDIOLIPIN ANTIBODIES, IGG, IGM, IGA
Anticardiolipin IgA: <9 APL U/mL (ref 0–11)
Anticardiolipin IgG: <9 GPL U/mL (ref 0–14)
Anticardiolipin IgM: <9 MPL U/mL (ref 0–12)

## 2021-03-22 LAB — HEPATITIS B SURFACE ANTIBODY,QUALITATIVE: Hep B S Ab: NONREACTIVE

## 2021-03-24 LAB — FACTOR 5 LEIDEN

## 2021-03-27 LAB — PROTHROMBIN GENE MUTATION

## 2021-04-01 ENCOUNTER — Emergency Department (HOSPITAL_COMMUNITY)
Admission: EM | Admit: 2021-04-01 | Discharge: 2021-04-01 | Disposition: A | Discharge status: Home / Self Care | Payer: Medicaid Other | Attending: Emergency Medicine | Admitting: Emergency Medicine

## 2021-04-01 ENCOUNTER — Emergency Department (HOSPITAL_COMMUNITY): Payer: Medicaid Other

## 2021-04-01 ENCOUNTER — Other Ambulatory Visit: Payer: Self-pay

## 2021-04-01 ENCOUNTER — Encounter (HOSPITAL_COMMUNITY): Payer: Self-pay

## 2021-04-01 DIAGNOSIS — Z79899 Other long term (current) drug therapy: Secondary | ICD-10-CM | POA: Diagnosis not present

## 2021-04-01 DIAGNOSIS — M79672 Pain in left foot: Secondary | ICD-10-CM | POA: Diagnosis not present

## 2021-04-01 DIAGNOSIS — M7989 Other specified soft tissue disorders: Secondary | ICD-10-CM | POA: Insufficient documentation

## 2021-04-01 DIAGNOSIS — Z7901 Long term (current) use of anticoagulants: Secondary | ICD-10-CM | POA: Diagnosis not present

## 2021-04-01 HISTORY — DX: Acute embolism and thrombosis of unspecified deep veins of unspecified lower extremity: I82.409

## 2021-04-01 NOTE — ED Triage Notes (Signed)
POV from home . Cc of left inner foot pain and swelling near the arch of his foot that he noticed this morning.  Denies any injury. Does not look to be red or warm to the touch.  Currently on eliquis for DVT in the left leg that he thinks has dissolved it because the last [place] was negative. Difficult to walk on.

## 2021-04-01 NOTE — ED Provider Notes (Signed)
[facility] Provider Note   CSN: [id number] Arrival date & time: 04/01/21  1200     History  Chief Complaint  Patient presents with   Foot Pain    left    Mark Glenn is a 36 y.o. male.   Foot Pain Pertinent negatives include no chest pain, no abdominal pain, no headaches and no shortness of breath.       Mark Glenn is a 36 y.o. male with past medical history of hypertension and prior left lower leg thrombus, currently anticoagulated with Eliquis.  He presents to the Emergency Department complaining of pain and swelling of the arch of the left foot.  Symptoms began this morning while at work.  He noticed a gradual aching throbbing sensation to the arch of the left foot that progressed throughout his work shift.  No known injury.  He took Tylenol earlier this morning and went to bed.  Woke this afternoon with worsening pain of the foot.  He describes an aching sensation while at rest and pain is more acute and sharp with movement and standing.  He denies fever, chills, shortness of breath, left lower leg pain swelling or redness.  Current symptoms are localized to the midfoot.   Home Medications Prior to Admission medications   Medication Sig Start Date End Date Taking? Authorizing Provider  acetaminophen (TYLENOL) 325 MG tablet Take 2 tablets (650 mg total) by mouth every 6 (six) hours as needed for mild pain (or Fever >/= 101). 09/17/20   Roxan Hockey, MD  amLODipine (NORVASC) 10 MG tablet Take 1 tablet (10 mg total) by mouth daily. 09/18/20   Roxan Hockey, MD  apixaban (ELIQUIS) 5 MG TABS tablet Take 2 tablets (10mg ) twice daily for 7 days, then 1 tablet (5mg ) twice daily 0/8/65   Campbell Stall P, DO  benzonatate (TESSALON) 100 MG capsule Take 1 capsule (100 mg total) by mouth every 8 (eight) hours. 02/07/21   Sherrill Raring, PA-C  Garlic 7846 MG CAPS Take 1 capsule by mouth daily.    [provider]  hydrALAZINE (APRESOLINE) 50 MG tablet Take 1  tablet (50 mg total) by mouth 2 (two) times daily. 09/17/20   Roxan Hockey, MD  HYDROcodone-acetaminophen (NORCO) 5-325 MG tablet Take 1 tablet by mouth every 6 (six) hours as needed for moderate pain. 10/26/27   Lianne Cure, DO  HYDROcodone-acetaminophen (NORCO/VICODIN) 5-325 MG tablet Take 1 tablet by mouth every 6 (six) hours as needed for severe pain. 01/13/21   Evalee Jefferson, PA-C  lisinopril-hydrochlorothiazide (ZESTORETIC) 20-12.5 MG tablet Take 1 tablet by mouth daily. 09/17/20   Roxan Hockey, MD  mesalamine (LIALDA) 1.2 g EC tablet Take 4 tablets (4.8 g total) by mouth daily with breakfast. 03/15/21   Mansouraty, Telford Nab., MD  Multiple Vitamin (ONE-A-DAY MENS PO) Take 1 tablet by mouth daily.    [provider]  omeprazole (PRILOSEC) 40 MG capsule Take 1 capsule (40 mg total) by mouth 2 (two) times daily. 01/25/21   Mansouraty, Telford Nab., MD  ondansetron (ZOFRAN) 4 MG tablet Take 1 tablet (4 mg total) by mouth every 6 (six) hours. 01/13/21   Evalee Jefferson, PA-C  potassium chloride (KLOR-CON) 10 MEQ tablet Take 1 tablet (10 mEq total) by mouth daily. Take While taking HCTZ 09/17/20   Roxan Hockey, MD      Allergies    Patient has no known allergies.    Review of Systems   Review of Systems  Respiratory:  Negative for shortness  of breath.   Cardiovascular:  Negative for chest pain.  Gastrointestinal:  Negative for abdominal pain, nausea and vomiting.  Musculoskeletal:  Positive for arthralgias (left foot pain) and joint swelling.  Skin:  Negative for color change, rash and wound.  Neurological:  Negative for dizziness, weakness, numbness and headaches.  Psychiatric/Behavioral:  Negative for confusion.   All other systems reviewed and are negative.  Physical Exam Updated Vital Signs BP (!) 157/77 (BP Location: Right Arm)    Pulse 65    Temp 98.1 F (36.7 C)    Resp 17    Ht 5\' 9"  (1.753 m)    Wt 113.1 kg    SpO2 97%    BMI 36.83 kg/m  Physical Exam Vitals and  nursing note reviewed.  Constitutional:      Appearance: Normal appearance.  Cardiovascular:     Rate and Rhythm: Normal rate and regular rhythm.     Pulses: Normal pulses.  Pulmonary:     Effort: No respiratory distress.     Breath sounds: Normal breath sounds.  Musculoskeletal:        General: Swelling and tenderness present. No signs of injury.     Comments: Localized edema and tenderness to left mid foot.  No wounds, lymphangitis excessive warmth or erythema.  Lower leg non tender, no edema or erythema  Skin:    General: Skin is warm.     Capillary Refill: Capillary refill takes less than 2 seconds.     Findings: No erythema or rash.  Neurological:     General: No focal deficit present.     Mental Status: He is alert.     Sensory: No sensory deficit.     Motor: No weakness.    ED Results / Procedures / Treatments   Labs (all labs ordered are listed, but only abnormal results are displayed) Labs Reviewed - No data to display  EKG None  Radiology DG Foot Complete Left  Result Date: 04/01/2021 CLINICAL DATA:  Pt c/o left foot pain and swelling on the medial portion near arch of his foot that he noticed this morning. Denies any injury. EXAM: LEFT FOOT - COMPLETE 3+ VIEW COMPARISON:  Left ankle x-rays 10/11/2019 FINDINGS: There is no evidence of fracture or dislocation. There are degenerative changes in the midfoot. Soft tissues are unremarkable. IMPRESSION: No acute osseous abnormality in the left foot. Electronically Signed   By: Audie Pinto M.D.   On: 04/01/2021 13:16    Procedures Procedures    Medications Ordered in ED Medications - No data to display  ED Course/ Medical Decision Making/ A&P                           Medical Decision Making Patient here with symptoms localized to the left midfoot.  Complains of pain and swelling to this area since last evening.  No known injury.  States that he stands for 10 hours on concrete floors.  No lower leg pain or  swelling.  Currently anticoagulated with Eliquis.  Amount and/or Complexity of Data Reviewed External Data Reviewed: notes.    Details: Reviewed prior medical records including left lower extremity ultrasound from 03/06/2021 that was negative for evidence of DVT/thrombus Radiology: ordered.    Details: X-ray of the left foot today shows degenerative changes of the midfoot without acute bony findings.  I personally reviewed imaging from today and agree with radiology interpretation.   Patient here with left foot pain  that is localized to the midfoot. X-ray today shows degenerative changes of the midfoot, no fracture or dislocation. Dorsalis pedis and posterior tibial pulses are palpable, he has good cap refill of the toes.  No concerning symptoms to suggest cellulitis.  Do not suspect recurrent DVT as he is currently anticoagulated and symptoms are localized to the midfoot.  I feel that his symptoms are likely related to degenerative changes.  He does have somewhat of a fallen arch on exam.  I will recommend supportive shoes/ shoe inserts, ice packs to the affected area and tylenol.  Doubt emergent process.  He appears appropriate for discharge home and agreeable to plan.         Final Clinical Impression(s) / ED Diagnoses Final diagnoses:  Foot pain, left    Rx / DC Orders ED Discharge Orders     None         Kem Parkinson, PA-C 04/01/21 1427    Fredia Sorrow, MD 04/02/21 [id number]

## 2021-04-01 NOTE — Discharge Instructions (Signed)
Elevate and apply ice packs on and off to your foot.  As discussed, I recommend that you buy arch supports to put in your shoes to wear while at work.  When you begin wearing them, I recommend that you wear for several hours at a time until the inserts feel more comfortable.  Tylenol for pain.  You may contact Dr. Arvil Persons office (foot doctor) to arrange a follow-up appointment if needed.

## 2021-04-07 NOTE — Progress Notes (Signed)
Mark Glenn, [place] [address]   CLINIC:  Medical Oncology/Hematology  PCP:  [facility] [address] [place] [address] [phone]   REASON FOR VISIT:  Follow-up for left leg DVT  CURRENT THERAPY: Eliquis 5 mg twice daily  INTERVAL HISTORY:  Mr. Bulthuis 36 y.o. male returns for routine follow-up of his left leg DVT.  He was seen for initial consultation by Dr. Delton Coombes and Tarri Abernethy PA-C on 03/20/2021.  At today's visit, he reports feeling fair.  No recent hospitalizations, surgeries, or changes in baseline health status.  He had an emergency department visit on 04/01/2021 for left foot pain and swelling.  ED provider did not suspect any recurrent DVT, since patient is anticoagulated and symptoms were localized to the midfoot.  He was suspected to have foot pain related to degenerative changes and following arch rather than any acute process.  He continues to remain on Eliquis as prescribed.  He does continue to have ongoing signs of GI bleeding (daily maroon-colored stools) due to his suspected IBD; he follows with gastroenterology.  He reports that his bleeding is "pretty much the same" compared to before starting Eliquis.  No other signs of bleeding.  He does complain of fatigue.  He continues to have intermittent left leg swelling/dependent edema if he stands for prolonged periods.  He has intermittent left leg and left foot pain.  He has 30% energy and 100% appetite. He endorses that he is maintaining a stable weight.   REVIEW OF SYSTEMS:  Review of Systems  Constitutional:  Positive for fatigue (energy 30%). Negative for appetite change, chills, diaphoresis, fever and unexpected weight change.  HENT:   Negative for lump/mass and nosebleeds.   Eyes:  Negative for eye problems.  Respiratory:  Negative for cough, hemoptysis and shortness of breath.   Cardiovascular:  Positive for leg swelling. Negative for  chest pain and palpitations.  Gastrointestinal:  Positive for blood in stool. Negative for abdominal pain, constipation, diarrhea, nausea and vomiting.  Genitourinary:  Negative for hematuria.   Skin: Negative.   Neurological:  Positive for headaches. Negative for dizziness and light-headedness.  Hematological:  Does not bruise/bleed easily.  Psychiatric/Behavioral:  Positive for sleep disturbance.      PAST MEDICAL/SURGICAL HISTORY:  Past Medical History:  Diagnosis Date   DVT of lower extremity (deep venous thrombosis) (HCC)    Hypertension    Kidney calculus    Past Surgical History:  Procedure Laterality Date   ANKLE SURGERY Right    MOUTH SURGERY     due to root canal/crown/abscess     SOCIAL HISTORY:  Social History   Socioeconomic History   Marital status: Married    Spouse name: Not on file   Number of children: 4   Years of education: Not on file   Highest education level: Not on file  Occupational History   Occupation: Sales  Tobacco Use   Smoking status: Former    Packs/day: 0.50    Types: Cigarettes    Quit date: 12/20/2020    Years since quitting: 0.2   Smokeless tobacco: Never  Vaping Use   Vaping Use: Every day  Substance and Sexual Activity   Alcohol use: Not Currently   Drug use: Never   Sexual activity: Not on file  Other Topics Concern   Not on file  Social History Narrative   Not on file   Social Determinants of Health   Financial Resource  Strain: Not on file  Food Insecurity: Not on file  Transportation Needs: Not on file  Physical Activity: Not on file  Stress: Not on file  Social Connections: Not on file  Intimate Partner Violence: Not on file    FAMILY HISTORY:  Family History  Problem Relation Age of Onset   Clotting disorder Mother    Heart failure Father    Clotting disorder Brother        x 2   Clotting disorder Maternal Grandmother    Hypertension Paternal Grandfather    Colon cancer Neg Hx    Stomach cancer Neg Hx     Esophageal cancer Neg Hx    Pancreatic cancer Neg Hx    Inflammatory bowel disease Neg Hx    Liver disease Neg Hx    Rectal cancer Neg Hx     CURRENT MEDICATIONS:  Outpatient Encounter Medications as of 04/10/2021  Medication Sig   acetaminophen (TYLENOL) 325 MG tablet Take 2 tablets (650 mg total) by mouth every 6 (six) hours as needed for mild pain (or Fever >/= 101).   amLODipine (NORVASC) 10 MG tablet Take 1 tablet (10 mg total) by mouth daily.   apixaban (ELIQUIS) 5 MG TABS tablet Take 2 tablets (10mg ) twice daily for 7 days, then 1 tablet (5mg ) twice daily   benzonatate (TESSALON) 100 MG capsule Take 1 capsule (100 mg total) by mouth every 8 (eight) hours.   Garlic 1696 MG CAPS Take 1 capsule by mouth daily.   hydrALAZINE (APRESOLINE) 50 MG tablet Take 1 tablet (50 mg total) by mouth 2 (two) times daily.   HYDROcodone-acetaminophen (NORCO) 5-325 MG tablet Take 1 tablet by mouth every 6 (six) hours as needed for moderate pain.   HYDROcodone-acetaminophen (NORCO/VICODIN) 5-325 MG tablet Take 1 tablet by mouth every 6 (six) hours as needed for severe pain.   lisinopril-hydrochlorothiazide (ZESTORETIC) 20-12.5 MG tablet Take 1 tablet by mouth daily.   mesalamine (LIALDA) 1.2 g EC tablet Take 4 tablets (4.8 g total) by mouth daily with breakfast.   Multiple Vitamin (ONE-A-DAY MENS PO) Take 1 tablet by mouth daily.   omeprazole (PRILOSEC) 40 MG capsule Take 1 capsule (40 mg total) by mouth 2 (two) times daily.   ondansetron (ZOFRAN) 4 MG tablet Take 1 tablet (4 mg total) by mouth every 6 (six) hours.   potassium chloride (KLOR-CON) 10 MEQ tablet Take 1 tablet (10 mEq total) by mouth daily. Take While taking HCTZ   No facility-administered encounter medications on file as of 04/10/2021.    ALLERGIES:  No Known Allergies   PHYSICAL EXAM:  ECOG PERFORMANCE STATUS: 1 - Symptomatic but completely ambulatory  There were no vitals filed for this visit. There were no vitals filed for  this visit. Physical Exam Constitutional:      Appearance: Normal appearance. He is obese.  HENT:     Head: Normocephalic and atraumatic.     Mouth/Throat:     Mouth: Mucous membranes are moist.  Eyes:     Extraocular Movements: Extraocular movements intact.     Pupils: Pupils are equal, round, and reactive to light.  Cardiovascular:     Rate and Rhythm: Normal rate and regular rhythm.     Pulses: Normal pulses.     Heart sounds: Normal heart sounds.  Pulmonary:     Effort: Pulmonary effort is normal.     Breath sounds: Normal breath sounds.  Abdominal:     General: Bowel sounds are normal.  Palpations: Abdomen is soft.     Tenderness: There is no abdominal tenderness.  Musculoskeletal:        General: No swelling.     Right lower leg: No edema.     Left lower leg: No edema.  Lymphadenopathy:     Cervical: No cervical adenopathy.  Skin:    General: Skin is warm and dry.  Neurological:     General: No focal deficit present.     Mental Status: He is alert and oriented to person, place, and time.  Psychiatric:        Mood and Affect: Mood normal.        Behavior: Behavior normal.     LABORATORY DATA:  I have reviewed the labs as listed.  CBC    Component Value Date/Time   WBC 6.9 03/20/2021 1032   RBC 4.49 03/20/2021 1032   HGB 13.8 03/20/2021 1032   HCT 40.1 03/20/2021 1032   PLT 309 03/20/2021 1032   MCV 89.3 03/20/2021 1032   MCH 30.7 03/20/2021 1032   MCHC 34.4 03/20/2021 1032   RDW 13.1 03/20/2021 1032   LYMPHSABS 2.2 03/20/2021 1032   MONOABS 0.4 03/20/2021 1032   EOSABS 0.3 03/20/2021 1032   BASOSABS 0.1 03/20/2021 1032   CMP Latest Ref Rng & Units 03/15/2021 01/13/2021 09/17/2020  Glucose 70 - 99 mg/dL 85 93 112(H)  BUN 6 - 23 mg/dL 14 16 14   Creatinine 0.40 - 1.50 mg/dL 0.96 0.98 0.80  Sodium 135 - 145 mEq/L 137 139 136  Potassium 3.5 - 5.1 mEq/L 4.1 3.8 3.2(L)  Chloride 96 - 112 mEq/L 101 106 106  CO2 19 - 32 mEq/L 29 25 23   Calcium 8.4 - 10.5  mg/dL 9.6 8.9 8.5(L)  Total Protein 6.0 - 8.3 g/dL 8.0 7.1 -  Total Bilirubin 0.2 - 1.2 mg/dL 0.7 0.6 -  Alkaline Phos 39 - 117 U/L 42 48 -  AST 0 - 37 U/L 21 22 -  ALT 0 - 53 U/L 32 31 -    DIAGNOSTIC IMAGING:  I have independently reviewed the relevant imaging and discussed with the patient.  ASSESSMENT & PLAN: 1.  Left lower extremity DVT - ED visit on 02/20/2021 with complaints of left lower leg pain and swelling x2 days - Venous duplex ultrasound (02/21/2021) showed nonocclusive thrombus in the left popliteal vein - Repeat venous duplex ultrasound (03/06/2021) showed resolution of left lower extremity DVT - No clear provoking events. The patient did test positive for COVID-19 on 02/07/2021.  He also sees GI for chronic colitis which is suspected to possibly be IBD. - Strong maternal family history of DVT and unspecified clotting disorder  - Coagulopathy work-up (03/20/2021): NORMAL (negative factor V Leiden, prothrombin gene mutation, beta glycoprotein antibodies, cardiolipin antibodies, and lupus anticoagulant) - D-dimer (03/20/2021) has returned to normal < 0.27 - He was started on Eliquis on 02/20/2021.  He has some ongoing maroon-colored stools, being followed by GI for suspected IBD.   - Intermittent left leg pain and dependent edema if he stands for too long. - DVT weakly provoked in the setting of COVID-19 infection.  However due to strong family history of coagulopathy and personal history of IBD, we will proceed with caution before discontinuing anticoagulation. - PLAN: Continue Eliquis 5 mg twice daily. - We will repeat CBC, D-dimer, and venous duplex ultrasound in 3 months. - RTC in 3 months, after testing - If he is deemed safe to discontinue anticoagulation in the future, we will  consider additional testing such as protein S, protein C, and Antithrombin III. - Patient has been instructed to contact his family members to find out the particular coagulopathy that they were diagnosed  with.  2.  Iron deficiency without anemia - CBC and iron panel checked due to ongoing rectal bleeding (follows with GI) - Labs (03/20/2021) show Hgb 13.8, but marginally low ferritin 32 with iron saturation 11% and TIBC 432 - PLAN: Recommend that he start taking ferrous sulfate 325 mg daily.  We will recheck labs at follow-up visit and consider IV iron if no improvement.     3.  Other history - PMH: Chronic colitis (possible IBD), PUD (gastritis and gastric ulcers), CHF, hypertension, nephrolithiasis, obesity  - SOCIAL: Lives at home with his wife and 4 children.  He works at [employer].  He is a former smoker, quit in November 2022.  He occasionally drinks alcohol in social settings.  He denies any current or previous use of illicit drugs. - FAMILY: Strong family history of heart disease (paternal), hypertension (paternal), and coagulopathy (maternal)   PLAN SUMMARY & DISPOSITION: Labs and venous duplex left leg in 3 months RTC after labs/US  All questions were answered. The patient knows to call the clinic with any problems, questions or concerns.  Medical decision making: Moderate  Time spent on visit: I spent 20 minutes counseling the patient face to face. The total time spent in the appointment was 30 minutes and more than 50% was on counseling.   Harriett Rush, PA-C  04/10/2021 9:51 AM

## 2021-04-10 ENCOUNTER — Inpatient Hospital Stay (HOSPITAL_COMMUNITY): Payer: Medicaid Other | Attending: Physician Assistant | Admitting: Physician Assistant

## 2021-04-10 ENCOUNTER — Other Ambulatory Visit: Payer: Self-pay

## 2021-04-10 VITALS — BP 132/79 | HR 71 | Temp 97.1°F | Resp 18 | Ht 69.49 in | Wt 253.3 lb

## 2021-04-10 DIAGNOSIS — Z79899 Other long term (current) drug therapy: Secondary | ICD-10-CM | POA: Diagnosis not present

## 2021-04-10 DIAGNOSIS — I509 Heart failure, unspecified: Secondary | ICD-10-CM | POA: Insufficient documentation

## 2021-04-10 DIAGNOSIS — E611 Iron deficiency: Secondary | ICD-10-CM | POA: Diagnosis not present

## 2021-04-10 DIAGNOSIS — Z7901 Long term (current) use of anticoagulants: Secondary | ICD-10-CM | POA: Diagnosis not present

## 2021-04-10 DIAGNOSIS — I110 Hypertensive heart disease with heart failure: Secondary | ICD-10-CM | POA: Insufficient documentation

## 2021-04-10 DIAGNOSIS — Z86718 Personal history of other venous thrombosis and embolism: Secondary | ICD-10-CM | POA: Diagnosis not present

## 2021-04-10 DIAGNOSIS — Z87891 Personal history of nicotine dependence: Secondary | ICD-10-CM | POA: Insufficient documentation

## 2021-04-10 DIAGNOSIS — I82432 Acute embolism and thrombosis of left popliteal vein: Secondary | ICD-10-CM

## 2021-04-10 NOTE — Patient Instructions (Signed)
[place] at [facility] Discharge Instructions  You were seen today by Tarri Abernethy PA-C for your history of left leg DVT (blood clot).  Your blood tests did not show any underlying blood clotting disorder that would have caused a blood clot.  Your blood clot may have been provoked by your COVID-19 infection.  Your inflammatory bowel disease is also a risk factor for blood clots.  Continue taking Eliquis 5 mg twice daily.  If you need refills, please contact our clinic and we will write new prescription for you.  Your labs did show some low iron levels, likely due to your ongoing rectal bleeding.  Start taking IRON tablet (FERROUS SULFATE 325 mg) once daily.  We will recheck your iron levels at your next visit.  We will schedule you for repeat labs and an ultrasound of your left leg in 3 months, with a follow-up visit after labs/ultrasound.  ------------   Thank you for choosing [place] at [facility] to provide your oncology and hematology care.  To afford each patient quality time with our provider, please arrive at least 15 minutes before your scheduled appointment time.   If you have a lab appointment with the [facility] please come in thru the Main Entrance and check in at the main information desk.  You need to re-schedule your appointment should you arrive 10 or more minutes late.  We strive to give you quality time with our providers, and arriving late affects you and other patients whose appointments are after yours.  Also, if you no show three or more times for appointments you may be dismissed from the clinic at the providers discretion.     Again, thank you for choosing [facility].  Our hope is that these requests will decrease the amount of time that you wait before being seen by our physicians.       _____________________________________________________________  Should you have questions after your visit  to [facility], please contact our office at [phone] and follow the prompts.  Our office hours are 8:00 a.m. and 4:30 p.m. Monday - Friday.  Please note that voicemails left after 4:00 p.m. may not be returned until the following business day.  We are closed weekends and major holidays.  You do have access to a nurse 24-7, just call the main number to the clinic [phone] and do not press any options, hold on the line and a nurse will answer the phone.    For prescription refill requests, have your pharmacy contact our office and allow 72 hours.    Due to Covid, you will need to wear a mask upon entering the hospital. If you do not have a mask, a mask will be given to you at the Main Entrance upon arrival. For doctor visits, patients may have 1 support person age 44 or older with them. For treatment visits, patients can not have anyone with them due to social distancing guidelines and our immunocompromised population.

## 2021-04-20 ENCOUNTER — Ambulatory Visit: Payer: Medicaid Other | Admitting: Gastroenterology

## 2021-05-02 ENCOUNTER — Encounter (HOSPITAL_COMMUNITY): Payer: Self-pay | Admitting: *Deleted

## 2021-05-02 ENCOUNTER — Emergency Department (HOSPITAL_COMMUNITY)
Admission: EM | Admit: 2021-05-02 | Discharge: 2021-05-02 | Disposition: A | Discharge status: Home / Self Care | Payer: Medicaid Other | Attending: Emergency Medicine | Admitting: Emergency Medicine

## 2021-05-02 ENCOUNTER — Emergency Department (HOSPITAL_COMMUNITY): Payer: Medicaid Other

## 2021-05-02 DIAGNOSIS — Z7901 Long term (current) use of anticoagulants: Secondary | ICD-10-CM | POA: Diagnosis not present

## 2021-05-02 DIAGNOSIS — R52 Pain, unspecified: Secondary | ICD-10-CM

## 2021-05-02 DIAGNOSIS — M79672 Pain in left foot: Secondary | ICD-10-CM | POA: Insufficient documentation

## 2021-05-02 NOTE — ED Notes (Signed)
Pt NAD, a/ox4. Pt verbalizes understanding of all DC and f/u instructions. All questions answered. Pt walks with steady gait to lobby at DC.  ? ?

## 2021-05-02 NOTE — ED Provider Notes (Signed)
?[facility] ?Provider Note ? ? ?CSN: [id number] ?Arrival date & time: 05/02/21  1412 ? ?  ? ?History ? ?Chief Complaint  ?Patient presents with  ? Foot Pain  ? ? ?Mark Glenn is a 36 y.o. male presenting today with pain in his left great toe and foot for 2 days.  Patient stated he was wearing boots and stubbed his great toe.  Felt immediate pain, still exquisitely tender.  Noticed some redness and swelling.  Describes the pain as constant and throbbing.  Denies recent infection or open wound of the left foot/toe.  Denies numbness or tingling in the toes.  Endorses reduced range of motion of the great toe.  Denies pain on the plantar side of the foot.  Hx of DVT of left popliteal vein diagnosed in 02/2021.  Was placed on eliquis and is still currently taking it.  Denies current leg pain, warmth, edema, or pain.  Currently being worked up for inflammatory bowel disease.   ? ?The history is provided by the patient and medical records.  ?Foot Pain ? ? ?  ? ?Home Medications ?Prior to Admission medications   ?Medication Sig Start Date End Date Taking? Authorizing Provider  ?acetaminophen (TYLENOL) 325 MG tablet Take 2 tablets (650 mg total) by mouth every 6 (six) hours as needed for mild pain (or Fever >/= 101). 09/17/20   Roxan Hockey, MD  ?amLODipine (NORVASC) 10 MG tablet Take 1 tablet (10 mg total) by mouth daily. 09/18/20   Roxan Hockey, MD  ?apixaban (ELIQUIS) 5 MG TABS tablet Take 2 tablets (63m) twice daily for 7 days, then 1 tablet (577m twice daily 1/05/21/94 GrCampbell Stall, DO  ?benzonatate (TESSALON) 100 MG capsule Take 1 capsule (100 mg total) by mouth every 8 (eight) hours. 02/07/21   SaSherrill RaringPA-C  ?Garlic [id number] CAPS Take 1 capsule by mouth daily.    [provider]  ?hydrALAZINE (APRESOLINE) 50 MG tablet Take 1 tablet (50 mg total) by mouth 2 (two) times daily. 09/17/20   EmRoxan HockeyMD  ?HYDROcodone-acetaminophen (NORCO) 5-325 MG tablet Take 1 tablet by mouth  every 6 (six) hours as needed for moderate pain. 1/08/28/87 GrLianne CureDO  ?lisinopril-hydrochlorothiazide (ZESTORETIC) 20-12.5 MG tablet Take 1 tablet by mouth daily. 09/17/20   EmRoxan HockeyMD  ?mesalamine (LIALDA) 1.2 g EC tablet Take 4 tablets (4.8 g total) by mouth daily with breakfast. 03/15/21   Mansouraty, GaTelford Nab MD  ?Multiple Vitamin (ONE-A-DAY MENS PO) Take 1 tablet by mouth daily.    [provider]  ?omeprazole (PRILOSEC) 40 MG capsule Take 1 capsule (40 mg total) by mouth 2 (two) times daily. 01/25/21   Mansouraty, GaTelford Nab MD  ?ondansetron (ZOFRAN) 4 MG tablet Take 1 tablet (4 mg total) by mouth every 6 (six) hours. 01/13/21   IdEvalee JeffersonPA-C  ?potassium chloride (KLOR-CON) 10 MEQ tablet Take 1 tablet (10 mEq total) by mouth daily. Take While taking HCTZ 09/17/20   EmRoxan HockeyMD  ?   ? ?Allergies    ?Patient has no known allergies.   ? ?Review of Systems   ?Review of Systems  ?Musculoskeletal:   ?     Left foot pain  ? ?Physical Exam ?Updated Vital Signs ?BP 139/81 (BP Location: Right Arm)   Pulse 73   Temp 98.4 ?F (36.9 ?C) (Oral)   Resp 20   Ht 5' 9"  (1.753 m)   Wt 113.4 kg   SpO2  99%   BMI 36.92 kg/m?  ?Physical Exam ?Vitals and nursing note reviewed.  ?Constitutional:   ?   General: He is not in acute distress. ?   Appearance: Normal appearance. He is well-developed. He is obese. He is not ill-appearing or diaphoretic.  ?HENT:  ?   Head: Normocephalic and atraumatic.  ?Eyes:  ?   Conjunctiva/sclera: Conjunctivae normal.  ?Cardiovascular:  ?   Rate and Rhythm: Normal rate and regular rhythm.  ?   Pulses: Normal pulses.  ?Pulmonary:  ?   Effort: Pulmonary effort is normal. No respiratory distress.  ?Abdominal:  ?   Palpations: Abdomen is soft.  ?Musculoskeletal:     ?   General: Tenderness and signs of injury present. No swelling.  ?   Cervical back: Neck supple.  ?   Right lower leg: No edema.  ?   Left lower leg: No edema.  ?     Feet: ? ?Feet:  ?   Comments:  Tender area as indicated above ?Right foot appears normal with normal ROM ?Left foot: great toe with reduced flexion and extension, tender to palpation, with good capillary refill, mild erythema, no open wound appreciated ?Rest of left foot unremarkable on exam ?2+ DP and PT pulses bilaterally ?Skin: ?   General: Skin is warm and dry.  ?   Capillary Refill: Capillary refill takes less than 2 seconds.  ?Neurological:  ?   Mental Status: He is alert and oriented to person, place, and time.  ?Psychiatric:     ?   Mood and Affect: Mood normal.  ? ? ?ED Results / Procedures / Treatments   ?Labs ?(all labs ordered are listed, but only abnormal results are displayed) ?Labs Reviewed - No data to display ? ?EKG ?None ? ?Radiology ?DG Foot Complete Left ? ?Result Date: 05/02/2021 ?CLINICAL DATA:  Pain at the great toe. EXAM: LEFT FOOT - COMPLETE 3+ VIEW COMPARISON:  Left foot x-ray 04/01/2021. FINDINGS: There is no evidence for acute fracture or dislocation. There stable degenerative changes of the dorsal talonavicular joint. There is some soft tissue swelling over the dorsum of the foot. No evidence for foreign body. IMPRESSION: 1. Soft tissue swelling over the dorsum of the foot. 2. No acute bony abnormality. Electronically Signed   By: Ronney Asters M.D.   On: 05/02/2021 15:44   ? ?Procedures ?Procedures  ? ? ?Medications Ordered in ED ?Medications - No data to display ? ?ED Course/ Medical Decision Making/ A&P ?  ?                        ?Medical Decision Making ?Amount and/or Complexity of Data Reviewed ?External Data Reviewed: notes. ?Radiology: ordered and independent interpretation performed. Decision-making details documented in ED Course. ? ?Risk ?OTC drugs. ? ? ?36 y.o. male presenting today with pain in his left great toe and foot for 2 days.  Patient stated he was wearing boots and stubbed his great toe.  Felt immediate pain, still exquisitely tender.  Noticed some redness and swelling.  Describes the pain as  constant and throbbing.  Denies recent infection or open wound of the left foot/toe.  Denies numbness or tingling in the toes.  Endorses reduced range of motion of the great toe.  Denies pain on the plantar side of the foot.  Hx of DVT of left popliteal vein diagnosed in 02/2021.  Was placed on eliquis and is still currently taking it.  Denies current leg pain,  warmth, edema, or pain.  Currently being worked up for inflammatory bowel disease.   ? ?I ordered and personally interpreted x-ray imaging of the left foot.  X-Ray negative for obvious fracture or dislocation. Pain managed in ED. Pt advised to follow up with orthopedics if symptoms persist for possibility of missed fracture diagnosis.  Did not appreciate swelling, erythema, warmth of lower extremities suggesting new or worsened DVT.  Conservative therapy recommended and discussed at length.  Patient will be dc home & is agreeable with above plan.  Patient currently on anticoagulation, NSAIDs not advised.  Recommended pain management with Tylenol.   ? ?I discussed the patient and their case with my attending, Dr. Kathrynn Humble, who agreed with the proposed treatment course.  After consideration of the diagnostic results and the patients response to treatment, I feel that the patent would benefit from outpatient pain management and follow up with primary care.  Discussed course of treatment thoroughly with the patient and he demonstrated understanding.  Patient in agreement and has no further questions.  ? ?This chart was dictated using voice recognition software.  Despite best efforts to proofread,  errors can occur which can change the documentation meaning. ? ? ? ? ? ? ? ?Final Clinical Impression(s) / ED Diagnoses ?Final diagnoses:  ?Foot pain, left  ? ? ?Rx / DC Orders ?ED Discharge Orders   ? ? None  ? ?  ? ? ?  ?Prince [place], PA-C ?82/70/78 2354 ? ?  ?Varney Biles, MD ?05/05/21 2308 ? ?

## 2021-05-02 NOTE — Discharge Instructions (Addendum)
During your visit today, imaging did not identify a fracture in your left foot/great toe.  This is most likely a contusion/soft tissue injury.   ? ?You may continue to utilize over-the-counter Tylenol as needed.  Please take one pill every 6 hours as needed for pain/inflammation ? ?Rest, ice, and elevate your foot as discussed.  Return to the ED for worsening symptoms as discussed. ? ?Follow up with your primary care office in the next 3-4 days for re-evaluation. ?

## 2021-05-02 NOTE — ED Triage Notes (Signed)
Left foot pain, states it is red and swollen ?

## 2021-05-18 ENCOUNTER — Emergency Department (HOSPITAL_COMMUNITY)
Admission: EM | Admit: 2021-05-18 | Discharge: 2021-05-18 | Disposition: A | Discharge status: Home / Self Care | Payer: Medicaid Other | Attending: Student | Admitting: Student

## 2021-05-18 ENCOUNTER — Encounter (HOSPITAL_COMMUNITY): Payer: Self-pay

## 2021-05-18 ENCOUNTER — Other Ambulatory Visit: Payer: Self-pay

## 2021-05-18 DIAGNOSIS — J028 Acute pharyngitis due to other specified organisms: Secondary | ICD-10-CM | POA: Diagnosis not present

## 2021-05-18 DIAGNOSIS — Z7901 Long term (current) use of anticoagulants: Secondary | ICD-10-CM | POA: Diagnosis not present

## 2021-05-18 DIAGNOSIS — B950 Streptococcus, group A, as the cause of diseases classified elsewhere: Secondary | ICD-10-CM | POA: Insufficient documentation

## 2021-05-18 DIAGNOSIS — B9789 Other viral agents as the cause of diseases classified elsewhere: Secondary | ICD-10-CM | POA: Diagnosis not present

## 2021-05-18 DIAGNOSIS — I10 Essential (primary) hypertension: Secondary | ICD-10-CM | POA: Diagnosis not present

## 2021-05-18 DIAGNOSIS — Z20822 Contact with and (suspected) exposure to covid-19: Secondary | ICD-10-CM | POA: Insufficient documentation

## 2021-05-18 DIAGNOSIS — Z79899 Other long term (current) drug therapy: Secondary | ICD-10-CM | POA: Insufficient documentation

## 2021-05-18 DIAGNOSIS — J029 Acute pharyngitis, unspecified: Secondary | ICD-10-CM | POA: Diagnosis present

## 2021-05-18 LAB — RESP PANEL BY RT-PCR (FLU A&B, COVID) ARPGX2
Influenza A by PCR: NEGATIVE
Influenza B by PCR: NEGATIVE
SARS Coronavirus 2 by RT PCR: NEGATIVE

## 2021-05-18 LAB — GROUP A STREP BY PCR: Group A Strep by PCR: DETECTED — AB

## 2021-05-18 MED ORDER — PENICILLIN G BENZATHINE 1200000 UNIT/2ML IM SUSY
1.2000 10*6.[IU] | PREFILLED_SYRINGE | Freq: Once | INTRAMUSCULAR | Status: AC
  Administered 2021-05-18: 1.2 10*6.[IU] via INTRAMUSCULAR
  Filled 2021-05-18: qty 2

## 2021-05-18 NOTE — ED Provider Notes (Signed)
?[facility] ?Provider Note ? ? ?CSN: [id number] ?Arrival date & time: 05/18/21  1639 ? ?  ? ?History ? ?Chief Complaint  ?Patient presents with  ? Sore Throat  ? ? ?Mark Glenn is a 36 y.o. male presents to the ED today with complaint of gradual onset, constant, achy, sore throat that began yesterday.  Denies any fevers or chills.  Denies recent sick contacts.  Denies any difficulty swallowing.  ? ?The history is provided by the patient and medical records.  ? ?  ? ?Home Medications ?Prior to Admission medications   ?Medication Sig Start Date End Date Taking? Authorizing Provider  ?acetaminophen (TYLENOL) 325 MG tablet Take 2 tablets (650 mg total) by mouth every 6 (six) hours as needed for mild pain (or Fever >/= 101). 09/17/20   Roxan Hockey, MD  ?amLODipine (NORVASC) 10 MG tablet Take 1 tablet (10 mg total) by mouth daily. 09/18/20   Roxan Hockey, MD  ?apixaban (ELIQUIS) 5 MG TABS tablet Take 2 tablets (73m) twice daily for 7 days, then 1 tablet (56m twice daily 1/06/22/95 GrCampbell Stall, DO  ?benzonatate (TESSALON) 100 MG capsule Take 1 capsule (100 mg total) by mouth every 8 (eight) hours. 02/07/21   SaSherrill RaringPA-C  ?Garlic [id number] CAPS Take 1 capsule by mouth daily.    [provider]  ?hydrALAZINE (APRESOLINE) 50 MG tablet Take 1 tablet (50 mg total) by mouth 2 (two) times daily. 09/17/20   EmRoxan HockeyMD  ?HYDROcodone-acetaminophen (NORCO) 5-325 MG tablet Take 1 tablet by mouth every 6 (six) hours as needed for moderate pain. 1/02/28/35 GrLianne CureDO  ?lisinopril-hydrochlorothiazide (ZESTORETIC) 20-12.5 MG tablet Take 1 tablet by mouth daily. 09/17/20   EmRoxan HockeyMD  ?mesalamine (LIALDA) 1.2 g EC tablet Take 4 tablets (4.8 g total) by mouth daily with breakfast. 03/15/21   Mansouraty, GaTelford Nab MD  ?Multiple Vitamin (ONE-A-DAY MENS PO) Take 1 tablet by mouth daily.    [provider]  ?omeprazole (PRILOSEC) 40 MG capsule Take 1 capsule (40 mg  total) by mouth 2 (two) times daily. 01/25/21   Mansouraty, GaTelford Nab MD  ?ondansetron (ZOFRAN) 4 MG tablet Take 1 tablet (4 mg total) by mouth every 6 (six) hours. 01/13/21   IdEvalee JeffersonPA-C  ?potassium chloride (KLOR-CON) 10 MEQ tablet Take 1 tablet (10 mEq total) by mouth daily. Take While taking HCTZ 09/17/20   EmRoxan HockeyMD  ?   ? ?Allergies    ?Patient has no known allergies.   ? ?Review of Systems   ?Review of Systems  ?Constitutional:  Negative for chills and fever.  ?HENT:  Positive for sore throat. Negative for trouble swallowing and voice change.   ?All other systems reviewed and are negative. ? ?Physical Exam ?Updated Vital Signs ?BP 117/71 (BP Location: Right Arm)   Pulse 67   Temp 98.1 ?F (36.7 ?C) (Oral)   Resp 15   Ht 5' 9"  (1.753 m)   Wt 113.4 kg   SpO2 100%   BMI 36.92 kg/m?  ?Physical Exam ?Vitals and nursing note reviewed.  ?Constitutional:   ?   Appearance: He is not ill-appearing.  ?HENT:  ?   Head: Normocephalic and atraumatic.  ?   Mouth/Throat:  ?   Mouth: Mucous membranes are moist.  ?   Pharynx: Oropharyngeal exudate and posterior oropharyngeal erythema present.  ?   Tonsils: 1+ on the right. 1+ on the left.  ?  Comments: Uvula is midline.  Phonating normally. Tolerating his own secretions without difficulty.  ?Eyes:  ?   Conjunctiva/sclera: Conjunctivae normal.  ?Cardiovascular:  ?   Rate and Rhythm: Normal rate and regular rhythm.  ?Pulmonary:  ?   Effort: Pulmonary effort is normal.  ?   Breath sounds: Normal breath sounds.  ?Skin: ?   General: Skin is warm and dry.  ?   Coloration: Skin is not jaundiced.  ?Neurological:  ?   Mental Status: He is alert.  ? ? ?ED Results / Procedures / Treatments   ?Labs ?(all labs ordered are listed, but only abnormal results are displayed) ?Labs Reviewed  ?GROUP A STREP BY PCR - Abnormal; Notable for the following components:  ?    Result Value  ? Group A Strep by PCR DETECTED (*)   ? All other components within normal limits  ?RESP  PANEL BY RT-PCR (FLU A&B, COVID) ARPGX2  ? ? ?EKG ?None ? ?Radiology ?No results found. ? ?Procedures ?Procedures  ? ? ?Medications Ordered in ED ?Medications  ?penicillin g benzathine (BICILLIN LA) 1200000 UNIT/2ML injection 1.2 Million Units (has no administration in time range)  ? ? ?ED Course/ Medical Decision Making/ A&P ?  ?                        ?Medical Decision Making ?36 year old male who presents to the ED today with complaint of sore throat that began yesterday.  On arrival to the ED vitals are stable.  Patient had COVID, flu, strep testing done prior to being seen.  Strep testing has returned positive at this time.  On my exam he has bilateral tonsillar hypertrophy, exudate, erythema.  Uvula midline.  She is tolerating her own secretions without difficulty and phonating normally.  No concern for deep space infection or PTA.  Patient chooses Bicillin IM.  Will provide and discharged home.  Patient instructed on ibuprofen and Tylenol as needed for pain.  He has been instructed on strict return precautions.  He is in agreement plan and stable for discharge. ? ?Problems Addressed: ?Viral pharyngitis: acute illness or injury ? ?Risk ?Prescription drug management. ? ? ? ? ? ? ? ? ? ?Final Clinical Impression(s) / ED Diagnoses ?Final diagnoses:  ?Viral pharyngitis  ? ? ?Rx / DC Orders ?ED Discharge Orders   ? ? None  ? ?  ? ? ? ?Discharge Instructions   ? ?  ?You have tested positive for strep throat today.  We have treated you with a 1 dose of antibiotic here in the ED.  You do not require additional antibiotics at this time. ? ?Continue taking ibuprofen and Tylenol as needed for pain.  Drink plenty of fluids to stay hydrated and rest as much as possible. ? ?Follow-up with your PCP for further eval. ? ?Return to the ED for any new/worsening symptoms including worsening sore throat, inability to swallow, drooling on yourself, muffled voice, or any other new/worsening symptoms ? ? ? ? ?  ?Eustaquio Maize,  PA-C ?05/18/21 1950 ? ?  ?Teressa Lower, MD ?05/19/21 1660 ? ?

## 2021-05-18 NOTE — ED Triage Notes (Signed)
Reports sore throat x 2 days.  Denies fever.  Resp even and unlabored.  Skin warm and dry.  nad ?

## 2021-05-18 NOTE — Discharge Instructions (Signed)
You have tested positive for strep throat today.  We have treated you with a 1 dose of antibiotic here in the ED.  You do not require additional antibiotics at this time. ? ?Continue taking ibuprofen and Tylenol as needed for pain.  Drink plenty of fluids to stay hydrated and rest as much as possible. ? ?Follow-up with your PCP for further eval. ? ?Return to the ED for any new/worsening symptoms including worsening sore throat, inability to swallow, drooling on yourself, muffled voice, or any other new/worsening symptoms ?

## 2021-05-31 ENCOUNTER — Other Ambulatory Visit: Payer: Self-pay | Admitting: Gastroenterology

## 2021-06-16 ENCOUNTER — Other Ambulatory Visit: Payer: Self-pay | Admitting: Gastroenterology

## 2021-06-23 ENCOUNTER — Other Ambulatory Visit: Payer: Self-pay | Admitting: Gastroenterology

## 2021-07-04 ENCOUNTER — Other Ambulatory Visit: Payer: Self-pay

## 2021-07-04 ENCOUNTER — Emergency Department (HOSPITAL_COMMUNITY)
Admission: EM | Admit: 2021-07-04 | Discharge: 2021-07-04 | Disposition: A | Discharge status: Home / Self Care | Payer: Medicaid Other | Attending: Emergency Medicine | Admitting: Emergency Medicine

## 2021-07-04 DIAGNOSIS — Z7901 Long term (current) use of anticoagulants: Secondary | ICD-10-CM | POA: Diagnosis not present

## 2021-07-04 DIAGNOSIS — I110 Hypertensive heart disease with heart failure: Secondary | ICD-10-CM | POA: Insufficient documentation

## 2021-07-04 DIAGNOSIS — I509 Heart failure, unspecified: Secondary | ICD-10-CM | POA: Diagnosis not present

## 2021-07-04 DIAGNOSIS — M546 Pain in thoracic spine: Secondary | ICD-10-CM | POA: Insufficient documentation

## 2021-07-04 DIAGNOSIS — Z87891 Personal history of nicotine dependence: Secondary | ICD-10-CM | POA: Diagnosis not present

## 2021-07-04 DIAGNOSIS — M542 Cervicalgia: Secondary | ICD-10-CM | POA: Insufficient documentation

## 2021-07-04 DIAGNOSIS — M7918 Myalgia, other site: Secondary | ICD-10-CM

## 2021-07-04 NOTE — ED Notes (Signed)
Pt shown to lobby.  Verbalized understanding discharge instructions. In no acute distress.  ? ?

## 2021-07-04 NOTE — ED Provider Notes (Signed)
?[facility] ?Provider Note ? ? ?CSN: [id number] ?Arrival date & time: 07/04/21  0446 ? ?  ? ?History ? ?Chief Complaint  ?Patient presents with  ? Back Pain  ? ? ?Mark Glenn is a 36 y.o. male. ? ?HPI ?He reports pain mid upper back and neck, for 2 days without known trauma.  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  He denies paresthesia, weakness, fever, chills, cough or shortness of breath.  He has multiple medical problems and has been taking his medicines which are prescribed regularly. ?  ? ?Home Medications ?Prior to Admission medications   ?Medication Sig Start Date End Date Taking? Authorizing Provider  ?acetaminophen (TYLENOL) 325 MG tablet Take 2 tablets (650 mg total) by mouth every 6 (six) hours as needed for mild pain (or Fever >/= 101). 09/17/20   Roxan Hockey, MD  ?amLODipine (NORVASC) 10 MG tablet Take 1 tablet (10 mg total) by mouth daily. 09/18/20   Roxan Hockey, MD  ?apixaban (ELIQUIS) 5 MG TABS tablet Take 2 tablets (77m) twice daily for 7 days, then 1 tablet (571m twice daily 1/03/25/21 GrCampbell Stall, DO  ?benzonatate (TESSALON) 100 MG capsule Take 1 capsule (100 mg total) by mouth every 8 (eight) hours. 02/07/21   SaSherrill RaringPA-C  ?Garlic [id number] CAPS Take 1 capsule by mouth daily.    [provider]  ?hydrALAZINE (APRESOLINE) 50 MG tablet Take 1 tablet (50 mg total) by mouth 2 (two) times daily. 09/17/20   EmRoxan HockeyMD  ?HYDROcodone-acetaminophen (NORCO) 5-325 MG tablet Take 1 tablet by mouth every 6 (six) hours as needed for moderate pain. 1/05/23/29 GrCampbell Stall, DO  ?LIALDA 1.2 g EC tablet TAKE 4 TABLETS BY MOUTH ONCE DAILY WITH BREAKFAST 06/26/21   Mansouraty, GaTelford Nab MD  ?lisinopril-hydrochlorothiazide (ZESTORETIC) 20-12.5 MG tablet Take 1 tablet by mouth daily. 09/17/20   EmRoxan HockeyMD  ?Multiple Vitamin (ONE-A-DAY MENS PO) Take 1 tablet by mouth daily.    [provider]   ?omeprazole (PRILOSEC) 40 MG capsule Take 1 capsule (40 mg total) by mouth 2 (two) times daily. 01/25/21   Mansouraty, GaTelford Nab MD  ?ondansetron (ZOFRAN) 4 MG tablet Take 1 tablet (4 mg total) by mouth every 6 (six) hours. 01/13/21   IdEvalee JeffersonPA-C  ?potassium chloride (KLOR-CON) 10 MEQ tablet Take 1 tablet (10 mEq total) by mouth daily. Take While taking HCTZ 09/17/20   EmRoxan HockeyMD  ?   ? ?Allergies    ?Patient has no known allergies.   ? ?Review of Systems   ?Review of Systems ? ?Physical Exam ?Updated Vital Signs ?BP 135/87 (BP Location: Right Arm)   Pulse 78   Temp 99 ?F (37.2 ?C) (Oral)   Resp 17   Ht 5' 10"  (1.778 m)   Wt 113.4 kg   SpO2 98%   BMI 35.87 kg/m?  ?Physical Exam ? ?ED Results / Procedures / Treatments   ?Labs ?(all labs ordered are listed, but only abnormal results are displayed) ?Labs Reviewed - No data to display ? ?EKG ?None ? ?Radiology ?No results found. ? ?Procedures ?Procedures  ? ? ?Medications Ordered in ED ?Medications - No data to display ? ?ED Course/ Medical Decision Making/ A&P ?  ?                        ?Medical Decision Making ?Patient presenting  with atraumatic upper back pain.  He has a history of congestive heart failure with preserved ejection fraction, hypertension, tobacco abuse and gastric ulcers. ? ?Problems Addressed: ?Musculoskeletal pain: acute illness or injury ?   Details: Atraumatic pain, possible overuse syndrome and deconditioning ? ?Amount and/or Complexity of Data Reviewed ?Independent Historian:  ?   Details: He is a cogent historian ? ?Risk ?OTC drugs. ?Decision regarding hospitalization. ?Risk Details: Patient presenting with neck and upper back pain, and reassuring physical examination.  Symptoms are nonspecific and most likely represent undefined musculoskeletal pain, without worrisome red flags for entities such as fracture, spinal myelopathy or acute infectious process including discitis.  Short-term illness, best treated  symptomatically with outpatient management by PCP.  Next step in management would be physical therapy.  Discharged with instruction to use heat and Tylenol. ? ? ? ? ? ? ? ? ? ? ?Final Clinical Impression(s) / ED Diagnoses ?Final diagnoses:  ?None  ? ? ?Rx / DC Orders ?ED Discharge Orders   ? ? None  ? ?  ? ? ?  ?Daleen Bo, MD ?07/04/21 [phone] ? ?

## 2021-07-04 NOTE — ED Triage Notes (Signed)
Pt c/o mid back pain and neck pain since Sunday.  ?

## 2021-07-04 NOTE — Discharge Instructions (Signed)
The cause of your pain is most likely muscular.  To treat that, take Tylenol 650 mg every 4 hours for pain.  Also using heat on the sore area and do some gentle stretching can be helpful.  If you not better in 1 week, call your doctor for follow-up appointment and asked them about getting into a physical therapy program. ?

## 2021-07-05 ENCOUNTER — Ambulatory Visit (HOSPITAL_COMMUNITY)
Admission: RE | Admit: 2021-07-05 | Discharge: 2021-07-05 | Disposition: A | Discharge status: Home / Self Care | Payer: Medicaid Other | Source: Ambulatory Visit | Attending: Physician Assistant | Admitting: Physician Assistant

## 2021-07-05 ENCOUNTER — Inpatient Hospital Stay (HOSPITAL_COMMUNITY): Payer: Medicaid Other | Attending: Hematology

## 2021-07-05 DIAGNOSIS — Z7901 Long term (current) use of anticoagulants: Secondary | ICD-10-CM | POA: Diagnosis not present

## 2021-07-05 DIAGNOSIS — E611 Iron deficiency: Secondary | ICD-10-CM | POA: Diagnosis present

## 2021-07-05 DIAGNOSIS — I82432 Acute embolism and thrombosis of left popliteal vein: Secondary | ICD-10-CM | POA: Insufficient documentation

## 2021-07-05 DIAGNOSIS — Z86718 Personal history of other venous thrombosis and embolism: Secondary | ICD-10-CM | POA: Insufficient documentation

## 2021-07-05 DIAGNOSIS — Z79899 Other long term (current) drug therapy: Secondary | ICD-10-CM | POA: Diagnosis not present

## 2021-07-05 DIAGNOSIS — Z87891 Personal history of nicotine dependence: Secondary | ICD-10-CM | POA: Insufficient documentation

## 2021-07-05 LAB — CBC WITH DIFFERENTIAL/PLATELET
Abs Immature Granulocytes: 0.02 10*3/uL (ref 0.00–0.07)
Basophils Absolute: 0 10*3/uL (ref 0.0–0.1)
Basophils Relative: 1 %
Eosinophils Absolute: 0.3 10*3/uL (ref 0.0–0.5)
Eosinophils Relative: 4 %
HCT: 39.3 % (ref 39.0–52.0)
Hemoglobin: 13.1 g/dL (ref 13.0–17.0)
Immature Granulocytes: 0 %
Lymphocytes Relative: 34 %
Lymphs Abs: 2.8 10*3/uL (ref 0.7–4.0)
MCH: 29.4 pg (ref 26.0–34.0)
MCHC: 33.3 g/dL (ref 30.0–36.0)
MCV: 88.1 fL (ref 80.0–100.0)
Monocytes Absolute: 0.5 10*3/uL (ref 0.1–1.0)
Monocytes Relative: 6 %
Neutro Abs: 4.5 10*3/uL (ref 1.7–7.7)
Neutrophils Relative %: 55 %
Platelets: 296 10*3/uL (ref 150–400)
RBC: 4.46 MIL/uL (ref 4.22–5.81)
RDW: 13.4 % (ref 11.5–15.5)
WBC: 8.2 10*3/uL (ref 4.0–10.5)
nRBC: 0 % (ref 0.0–0.2)

## 2021-07-05 LAB — IRON AND TIBC
Iron: 57 ug/dL (ref 45–182)
Saturation Ratios: 16 % — ABNORMAL LOW (ref 17.9–39.5)
TIBC: 366 ug/dL (ref 250–450)
UIBC: 309 ug/dL

## 2021-07-05 LAB — FERRITIN: Ferritin: 45 ng/mL (ref 24–336)

## 2021-07-05 LAB — D-DIMER, QUANTITATIVE: D-Dimer, Quant: 0.29 ug/mL-FEU (ref 0.00–0.50)

## 2021-07-11 NOTE — Progress Notes (Deleted)
[facility] [address], [place] [address]   CLINIC:  Medical Oncology/Hematology  PCP:  Diamantina Monks [facility] [place] [facility] [address] [place] [address] [phone]   REASON FOR VISIT:  Follow-up for left leg DVT   CURRENT THERAPY: Eliquis 5 mg twice daily   INTERVAL HISTORY:  [name] 36 y.o. male returns for routine follow-up of his left leg DVT.  He was last seen by Rojelio Brenner PA-C on 04/10/2021.  At today's visit, he reports feeling ***.  No recent hospitalizations, surgeries, or changes in baseline health status.   *** He continues to remain on Eliquis as prescribed. *** He does continue to have ongoing signs of GI bleeding (daily maroon-colored stools) due to his suspected IBD; he follows with gastroenterology.  He reports that his bleeding is "pretty much the same" compared to before starting Eliquis.  No other signs of bleeding. *** He does complain of fatigue. *** Iron pill    *** He continues to have intermittent left leg swelling/dependent edema if he stands for prolonged periods.   *** He has intermittent left leg and left foot pain.  (Left foot pain is thought to be secondary to degenerative changes and falling arch.) *** He denies any chest pain, cough, shortness of breath, dyspnea on exertion, or hemoptysis.  *** Family clotting disorder??  He has ***% energy and ***% appetite. He endorses that he is maintaining a stable weight.    REVIEW OF SYSTEMS:  Review of Systems - Oncology    PAST MEDICAL/SURGICAL HISTORY:  Past Medical History:  Diagnosis Date   DVT of lower extremity (deep venous thrombosis) (HCC)    Hypertension    Kidney calculus    Past Surgical History:  Procedure Laterality Date   ANKLE SURGERY Right    MOUTH SURGERY     due to root canal/crown/abscess     SOCIAL HISTORY:  Social History   Socioeconomic History   Marital status: Married    Spouse name: Not on file   Number of children: 4    Years of education: Not on file   Highest education level: Not on file  Occupational History   Occupation: Sales  Tobacco Use   Smoking status: Former    Packs/day: 0.50    Types: Cigarettes    Quit date: 12/20/2020    Years since quitting: 0.5   Smokeless tobacco: Never  Vaping Use   Vaping Use: Every day  Substance and Sexual Activity   Alcohol use: Not Currently   Drug use: Never   Sexual activity: Not on file  Other Topics Concern   Not on file  Social History Narrative   Not on file   Social Determinants of Health   Financial Resource Strain: Not on file  Food Insecurity: Not on file  Transportation Needs: Not on file  Physical Activity: Not on file  Stress: Not on file  Social Connections: Not on file  Intimate Partner Violence: Not on file    FAMILY HISTORY:  Family History  Problem Relation Age of Onset   Clotting disorder Mother    Heart failure Father    Clotting disorder Brother        x 2   Clotting disorder Maternal Grandmother    Hypertension Paternal Grandfather    Colon cancer Neg Hx    Stomach cancer Neg Hx    Esophageal cancer Neg Hx    Pancreatic cancer Neg Hx    Inflammatory bowel disease  Neg Hx    Liver disease Neg Hx    Rectal cancer Neg Hx     CURRENT MEDICATIONS:  Outpatient Encounter Medications as of 07/12/2021  Medication Sig   acetaminophen (TYLENOL) 325 MG tablet Take 2 tablets (650 mg total) by mouth every 6 (six) hours as needed for mild pain (or Fever >/= 101).   amLODipine (NORVASC) 10 MG tablet Take 1 tablet (10 mg total) by mouth daily.   apixaban (ELIQUIS) 5 MG TABS tablet Take 2 tablets (10mg ) twice daily for 7 days, then 1 tablet (5mg ) twice daily   benzonatate (TESSALON) 100 MG capsule Take 1 capsule (100 mg total) by mouth every 8 (eight) hours.   Garlic 1000 MG CAPS Take 1 capsule by mouth daily.   hydrALAZINE (APRESOLINE) 50 MG tablet Take 1 tablet (50 mg total) by mouth 2 (two) times daily.    HYDROcodone-acetaminophen (NORCO) 5-325 MG tablet Take 1 tablet by mouth every 6 (six) hours as needed for moderate pain.   LIALDA 1.2 g EC tablet TAKE 4 TABLETS BY MOUTH ONCE DAILY WITH BREAKFAST   lisinopril-hydrochlorothiazide (ZESTORETIC) 20-12.5 MG tablet Take 1 tablet by mouth daily.   Multiple Vitamin (ONE-A-DAY MENS PO) Take 1 tablet by mouth daily.   omeprazole (PRILOSEC) 40 MG capsule Take 1 capsule (40 mg total) by mouth 2 (two) times daily.   ondansetron (ZOFRAN) 4 MG tablet Take 1 tablet (4 mg total) by mouth every 6 (six) hours.   potassium chloride (KLOR-CON) 10 MEQ tablet Take 1 tablet (10 mEq total) by mouth daily. Take While taking HCTZ   No facility-administered encounter medications on file as of 07/12/2021.    ALLERGIES:  No Known Allergies   PHYSICAL EXAM:  ECOG PERFORMANCE STATUS: {CHL ONC ECOG PS:[id number]}  There were no vitals filed for this visit. There were no vitals filed for this visit. Physical Exam   LABORATORY DATA:  I have reviewed the labs as listed.  CBC    Component Value Date/Time   WBC 8.2 07/05/2021 0842   RBC 4.46 07/05/2021 0842   HGB 13.1 07/05/2021 0842   HCT 39.3 07/05/2021 0842   PLT 296 07/05/2021 0842   MCV 88.1 07/05/2021 0842   MCH 29.4 07/05/2021 0842   MCHC 33.3 07/05/2021 0842   RDW 13.4 07/05/2021 0842   LYMPHSABS 2.8 07/05/2021 0842   MONOABS 0.5 07/05/2021 0842   EOSABS 0.3 07/05/2021 0842   BASOSABS 0.0 07/05/2021 0842      Latest Ref Rng & Units 03/15/2021    3:19 PM 01/13/2021    3:37 PM 09/17/2020    8:03 AM  CMP  Glucose 70 - 99 mg/dL 85   93   782    BUN 6 - 23 mg/dL 14   16   14     Creatinine 0.40 - 1.50 mg/dL 9.56   2.13   0.86    Sodium 135 - 145 mEq/L 137   139   136    Potassium 3.5 - 5.1 mEq/L 4.1   3.8   3.2    Chloride 96 - 112 mEq/L 101   106   106    CO2 19 - 32 mEq/L 29   25   23     Calcium 8.4 - 10.5 mg/dL 9.6   8.9   8.5    Total Protein 6.0 - 8.3 g/dL 8.0   7.1     Total Bilirubin 0.2 -  1.2 mg/dL 0.7   0.6  Alkaline Phos 39 - 117 U/L 42   48     AST 0 - 37 U/L 21   22     ALT 0 - 53 U/L 32   31       DIAGNOSTIC IMAGING:  I have independently reviewed the relevant imaging and discussed with the patient.  ASSESSMENT & PLAN: 1.  Left lower extremity DVT - ED visit on 02/20/2021 with complaints of left lower leg pain and swelling x2 days - Venous duplex ultrasound (02/21/2021) showed nonocclusive thrombus in the left popliteal vein - Repeat venous duplex ultrasound (03/06/2021) showed resolution of left lower extremity DVT - Venous duplex US LLE (07/05/2021): No evidence of deep venous thrombosis - No clear provoking events. The patient did test positive for COVID-19 on 02/07/2021.  He also sees GI for chronic colitis which is suspected to possibly be IBD. - Strong maternal family history of DVT and unspecified clotting disorder  - Coagulopathy work-up (03/20/2021): NORMAL (negative factor V Leiden, prothrombin gene mutation, beta glycoprotein antibodies, cardiolipin antibodies, and lupus anticoagulant) -Most recent D-dimer (07/05/2021): Normal at 0.29 - He was started on Eliquis on 02/20/2021.  He has some ongoing maroon-colored stools, being followed by GI for suspected IBD.   ***  - Intermittent left leg pain and dependent edema if he stands for too long. ***  - In the setting of single episode of weakly provoked DVT with completion of 6 months anticoagulation, confirmed resolution of DVT, and negative hypercoagulable work-up, it would not be unreasonable for him to discontinue anticoagulation, but keeping the following caveats in mind: DVT weakly provoked in the setting of COVID-19 infection. However, due to personal history of IBD, explained to the patient has 2-3 x higher risk of developing blood clots than the general population. Additionally, he has a strong family history of blood clots and unspecified coagulopathy. Discussed with patient's that since he has had single  episode of DVT in the past, he is now at increased risk for recurrent blood clots in the future. - Patient verbalizes understanding of the risks of stopping anticoagulation, and is agreeable with discontinuing Eliquis at this time.  *** He is aware that if he has any blood clots in the future, he will likely require lifelong anticoagulation. - PLAN: Continue Eliquis 5 mg twice daily. ***  - If he is deemed safe to discontinue anticoagulation in the future, we will consider additional testing such as protein S, protein C, and Antithrombin III. *** RTC after labs to discuss results.  *** - Patient has been instructed to contact his family members to find out the particular coagulopathy that they were diagnosed with. ***    2.  Iron deficiency without anemia - CBC and iron panel checked due to ongoing rectal bleeding (follows with GI) ** - Labs (03/20/2021) showed Hgb 13.8, but marginally low ferritin 32 with iron saturation 11% and TIBC 432 - Patient has been taking ferrous sulfate 325 mg daily *** - Most recent labs (07/05/2021): Hgb 13.1/MCV 88.1, ferritin 45, iron saturation 16% - PLAN: Iron panel is improving.  Recommend continuing ferrous sulfate 325 mg daily.  3.  Other history - PMH: Chronic colitis (possible IBD), PUD (gastritis and gastric ulcers), CHF, hypertension, nephrolithiasis, obesity  - SOCIAL: Lives at home with his wife and 4 children.  He works at [employer].  He is a former smoker, quit in November 2022.  He occasionally drinks alcohol in social settings.  He denies any current or previous use of illicit  drugs. - FAMILY: Strong family history of heart disease (paternal), hypertension (paternal), and coagulopathy (maternal)   PLAN SUMMARY & DISPOSITION: ***  All questions were answered. The patient knows to call the clinic with any problems, questions or concerns.  Medical decision making: ***  Time spent on visit: I spent {CHL ONC TIME VISIT - BJYNW:[id number]} counseling the  patient face to face. The total time spent in the appointment was {CHL ONC TIME VISIT - VHQIO:[id number]} and more than 50% was on counseling.   Carnella Guadalajara, PA-C  ***

## 2021-07-12 ENCOUNTER — Inpatient Hospital Stay (HOSPITAL_COMMUNITY): Payer: Medicaid Other | Admitting: Physician Assistant

## 2021-07-25 NOTE — Progress Notes (Unsigned)
[place], [place] [address]   CLINIC:  Medical Oncology/Hematology  PCP:  [facility] [address] [place] [address] [phone]   REASON FOR VISIT:  Follow-up for left leg DVT   CURRENT THERAPY: Eliquis 5 mg twice daily   INTERVAL HISTORY:  Mark Glenn 36 y.o. male returns for routine follow-up of his left leg DVT.  He was last seen by Tarri Abernethy PA-C on 04/10/2021.  At today's visit, he reports feeling well apart from being tired.  No recent hospitalizations, surgeries, or changes in baseline health status.  He continues to remain on Eliquis as prescribed. He does continue to have ongoing signs of GI bleeding (daily maroon-colored stools) due to his suspected IBD; he follows with gastroenterology.  He reports that his bleeding is "pretty much the same" compared to before starting Eliquis.  No other signs of bleeding. He does complain of fatigue. He is taking his iron tablet once daily without any issues.   He continues to have intermittent left leg swelling/dependent edema if he stands for prolonged periods.   He has intermittent left leg and left foot pain.  (Left foot pain is thought to be secondary to degenerative changes and falling arch.)  He has intermittent dyspnea on exertion.  He denies any chest pain, cough, shortness of breath at rest, or hemoptysis.  He has little to no energy and 50% appetite. He endorses that he is maintaining a stable weight.   REVIEW OF SYSTEMS:  Review of Systems  Constitutional:  Positive for fatigue. Negative for appetite change, chills, diaphoresis, fever and unexpected weight change.  HENT:   Negative for lump/mass and nosebleeds.   Eyes:  Negative for eye problems.  Respiratory:  Positive for shortness of breath (with exertion). Negative for cough and hemoptysis.   Cardiovascular:  Negative for chest pain, leg swelling and palpitations.  Gastrointestinal:  Positive for  blood in stool (inflammatory bowel disease) and diarrhea. Negative for abdominal pain, constipation, nausea and vomiting.  Genitourinary:  Negative for hematuria.   Skin: Negative.   Neurological:  Positive for headaches. Negative for dizziness and light-headedness.  Hematological:  Does not bruise/bleed easily.  Psychiatric/Behavioral:  Positive for sleep disturbance.      PAST MEDICAL/SURGICAL HISTORY:  Past Medical History:  Diagnosis Date   DVT of lower extremity (deep venous thrombosis) (HCC)    Hypertension    Kidney calculus    Past Surgical History:  Procedure Laterality Date   ANKLE SURGERY Right    MOUTH SURGERY     due to root canal/crown/abscess     SOCIAL HISTORY:  Social History   Socioeconomic History   Marital status: Married    Spouse name: Not on file   Number of children: 4   Years of education: Not on file   Highest education level: Not on file  Occupational History   Occupation: Sales  Tobacco Use   Smoking status: Former    Packs/day: 0.50    Types: Cigarettes    Quit date: 12/20/2020    Years since quitting: 0.5   Smokeless tobacco: Never  Vaping Use   Vaping Use: Every day  Substance and Sexual Activity   Alcohol use: Not Currently   Drug use: Never   Sexual activity: Not on file  Other Topics Concern   Not on file  Social History Narrative   Not on file   Social Determinants of Health   Financial Resource Strain:  Not on file  Food Insecurity: Not on file  Transportation Needs: Not on file  Physical Activity: Not on file  Stress: Not on file  Social Connections: Not on file  Intimate Partner Violence: Not on file    FAMILY HISTORY:  Family History  Problem Relation Age of Onset   Clotting disorder Mother    Heart failure Father    Clotting disorder Brother        x 2   Clotting disorder Maternal Grandmother    Hypertension Paternal Grandfather    Colon cancer Neg Hx    Stomach cancer Neg Hx    Esophageal cancer Neg Hx     Pancreatic cancer Neg Hx    Inflammatory bowel disease Neg Hx    Liver disease Neg Hx    Rectal cancer Neg Hx     CURRENT MEDICATIONS:  Outpatient Encounter Medications as of 07/26/2021  Medication Sig   acetaminophen (TYLENOL) 325 MG tablet Take 2 tablets (650 mg total) by mouth every 6 (six) hours as needed for mild pain (or Fever >/= 101).   amLODipine (NORVASC) 10 MG tablet Take 1 tablet (10 mg total) by mouth daily.   apixaban (ELIQUIS) 5 MG TABS tablet Take 2 tablets (74m) twice daily for 7 days, then 1 tablet (580m twice daily   benzonatate (TESSALON) 100 MG capsule Take 1 capsule (100 mg total) by mouth every 8 (eight) hours.   Garlic 103614G CAPS Take 1 capsule by mouth daily.   hydrALAZINE (APRESOLINE) 50 MG tablet Take 1 tablet (50 mg total) by mouth 2 (two) times daily.   HYDROcodone-acetaminophen (NORCO) 5-325 MG tablet Take 1 tablet by mouth every 6 (six) hours as needed for moderate pain.   LIALDA 1.2 g EC tablet TAKE 4 TABLETS BY MOUTH ONCE DAILY WITH BREAKFAST   lisinopril-hydrochlorothiazide (ZESTORETIC) 20-12.5 MG tablet Take 1 tablet by mouth daily.   Multiple Vitamin (ONE-A-DAY MENS PO) Take 1 tablet by mouth daily.   omeprazole (PRILOSEC) 40 MG capsule Take 1 capsule (40 mg total) by mouth 2 (two) times daily.   ondansetron (ZOFRAN) 4 MG tablet Take 1 tablet (4 mg total) by mouth every 6 (six) hours.   potassium chloride (KLOR-CON) 10 MEQ tablet Take 1 tablet (10 mEq total) by mouth daily. Take While taking HCTZ   No facility-administered encounter medications on file as of 07/26/2021.    ALLERGIES:  No Known Allergies   PHYSICAL EXAM:  ECOG PERFORMANCE STATUS: 1 - Symptomatic but completely ambulatory  There were no vitals filed for this visit. There were no vitals filed for this visit. Physical Exam Constitutional:      Appearance: Normal appearance. He is obese.  HENT:     Head: Normocephalic and atraumatic.     Mouth/Throat:     Mouth: Mucous  membranes are moist.  Eyes:     Extraocular Movements: Extraocular movements intact.     Pupils: Pupils are equal, round, and reactive to light.  Cardiovascular:     Rate and Rhythm: Normal rate and regular rhythm.     Pulses: Normal pulses.     Heart sounds: Normal heart sounds.  Pulmonary:     Effort: Pulmonary effort is normal.     Breath sounds: Normal breath sounds.  Abdominal:     General: Bowel sounds are normal.     Palpations: Abdomen is soft.     Tenderness: There is no abdominal tenderness.  Musculoskeletal:        General: No  swelling.     Right lower leg: No edema.     Left lower leg: No edema.  Lymphadenopathy:     Cervical: No cervical adenopathy.  Skin:    General: Skin is warm and dry.  Neurological:     General: No focal deficit present.     Mental Status: He is alert and oriented to person, place, and time.  Psychiatric:        Mood and Affect: Mood normal.        Behavior: Behavior normal.     LABORATORY DATA:  I have reviewed the labs as listed.  CBC    Component Value Date/Time   WBC 8.2 07/05/2021 0842   RBC 4.46 07/05/2021 0842   HGB 13.1 07/05/2021 0842   HCT 39.3 07/05/2021 0842   PLT 296 07/05/2021 0842   MCV 88.1 07/05/2021 0842   MCH 29.4 07/05/2021 0842   MCHC 33.3 07/05/2021 0842   RDW 13.4 07/05/2021 0842   LYMPHSABS 2.8 07/05/2021 0842   MONOABS 0.5 07/05/2021 0842   EOSABS 0.3 07/05/2021 0842   BASOSABS 0.0 07/05/2021 0842      Latest Ref Rng & Units 03/15/2021    3:19 PM 01/13/2021    3:37 PM 09/17/2020    8:03 AM  CMP  Glucose 70 - 99 mg/dL 85   93   112    BUN 6 - 23 mg/dL 14   16   14     Creatinine 0.40 - 1.50 mg/dL 0.96   0.98   0.80    Sodium 135 - 145 mEq/L 137   139   136    Potassium 3.5 - 5.1 mEq/L 4.1   3.8   3.2    Chloride 96 - 112 mEq/L 101   106   106    CO2 19 - 32 mEq/L 29   25   23     Calcium 8.4 - 10.5 mg/dL 9.6   8.9   8.5    Total Protein 6.0 - 8.3 g/dL 8.0   7.1     Total Bilirubin 0.2 - 1.2 mg/dL  0.7   0.6     Alkaline Phos 39 - 117 U/L 42   48     AST 0 - 37 U/L 21   22     ALT 0 - 53 U/L 32   31       DIAGNOSTIC IMAGING:  I have independently reviewed the relevant imaging and discussed with the patient.  ASSESSMENT & PLAN: 1.  Left lower extremity DVT - ED visit on 02/20/2021 with complaints of left lower leg pain and swelling x2 days - Venous duplex ultrasound (02/21/2021) showed nonocclusive thrombus in the left popliteal vein - Repeat venous duplex ultrasound (03/06/2021) showed resolution of left lower extremity DVT - Venous duplex US LLE (07/05/2021): No evidence of deep venous thrombosis - No clear provoking events. The patient did test positive for COVID-19 on 02/07/2021.  He also sees GI for chronic colitis which is suspected to possibly be IBD. - Strong maternal family history of DVT and unspecified clotting disorder.  Reports that his sister had pulmonary embolism. - Coagulopathy work-up (03/20/2021): NORMAL (negative factor V Leiden, prothrombin gene mutation, beta glycoprotein antibodies, cardiolipin antibodies, and lupus anticoagulant) - Most recent D-dimer (07/05/2021): Normal at 0.29 - He was started on Eliquis on 02/20/2021.  He has some ongoing maroon-colored stools, being followed by GI for suspected IBD.     - Intermittent left leg pain and  dependent edema if he stands for too long;  he has developed some dyspnea on exertion. - In the setting of single episode of weakly provoked DVT with completion of 6 months anticoagulation, confirmed resolution of DVT, and negative hypercoagulable work-up, it would not be unreasonable for him to discontinue anticoagulation, but keeping the following caveats in mind: DVT weakly provoked in the setting of COVID-19 infection. However, due to personal history of IBD, explained to the patient has 2-3 x higher risk of developing blood clots than the general population. Additionally, he has a strong family history of blood clots and unspecified  coagulopathy. Discussed with patient's that since he has had single episode of DVT in the past, he is now at increased risk for recurrent blood clots in the future. - Patient verbalizes understanding of the risks of stopping anticoagulation, and is agreeable with discontinuing Eliquis at this time.  He is aware that if he has any blood clots in the future, he will likely require lifelong anticoagulation. - PLAN: Discontinuation of Eliquis as above. - We will check additional coagulopathy tests (protein S, protein C, and Antithrombin III) and discuss at follow-up visit in 3 months   2.  Iron deficiency without anemia - CBC and iron panel checked due to ongoing rectal bleeding (follows with GI - Dr. Rush [place]mark)  - Labs (03/20/2021) showed Hgb 13.8, but marginally low ferritin 32 with iron saturation 11% and TIBC 432 - Patient has been taking ferrous sulfate 325 mg daily  - Most recent labs (07/05/2021): Hgb 13.1/MCV 88.1, ferritin 45, iron saturation 16% - PLAN: Iron panel is improving.  Recommend continuing ferrous sulfate 325 mg daily. - We will recheck at follow-up visit in 3 months.  If patient continues to have iron deficiency (ferritin <50 or saturation <20%), would consider IV iron at his next visit due to his ongoing symptoms of fatigue.  3.  Other history - PMH: Chronic colitis (possible IBD), PUD (gastritis and gastric ulcers), CHF, hypertension, nephrolithiasis, obesity  - SOCIAL: Lives at home with his wife and 4 children.  He works at [employer].  He is a former smoker, quit in November 2022.  He occasionally drinks alcohol in social settings.  He denies any current or previous use of illicit drugs. - FAMILY: Strong family history of heart disease (paternal), hypertension (paternal), and coagulopathy (maternal)   All questions were answered. The patient knows to call the clinic with any problems, questions or concerns.  Medical decision making: Moderate  Time spent on visit: I spent 20  minutes counseling the patient face to face. The total time spent in the appointment was 30 minutes and more than 50% was on counseling.   Harriett Rush, PA-C  07/26/2021 2:40 PM

## 2021-07-26 ENCOUNTER — Inpatient Hospital Stay (HOSPITAL_COMMUNITY): Payer: Medicaid Other | Attending: Hematology | Admitting: Physician Assistant

## 2021-07-26 VITALS — BP 131/71 | HR 72 | Temp 97.6°F | Resp 18 | Ht 70.0 in | Wt 252.6 lb

## 2021-07-26 DIAGNOSIS — Z86718 Personal history of other venous thrombosis and embolism: Secondary | ICD-10-CM | POA: Diagnosis present

## 2021-07-26 DIAGNOSIS — E611 Iron deficiency: Secondary | ICD-10-CM

## 2021-07-26 DIAGNOSIS — F17210 Nicotine dependence, cigarettes, uncomplicated: Secondary | ICD-10-CM | POA: Diagnosis not present

## 2021-07-26 DIAGNOSIS — I82432 Acute embolism and thrombosis of left popliteal vein: Secondary | ICD-10-CM

## 2021-07-26 DIAGNOSIS — Z7901 Long term (current) use of anticoagulants: Secondary | ICD-10-CM | POA: Insufficient documentation

## 2021-07-26 DIAGNOSIS — Z79899 Other long term (current) drug therapy: Secondary | ICD-10-CM | POA: Diagnosis not present

## 2021-07-26 NOTE — Patient Instructions (Addendum)
[place] at [facility] Discharge Instructions  You were seen today by Tarri Abernethy PA-C for your history of left leg DVT (blood clot).  Your blood tests did not show any underlying blood clotting disorder that would have caused a blood clot.  Your blood clot may have been provoked by your COVID-19 infection.  Your inflammatory bowel disease is also a risk factor for blood clots.  Your blood clots have resolved.  You can stop taking Eliquis at this time.  We will check some additional labs in about 3 months and discuss further at follow-up visit after labs.  Continue taking IRON tablet (FERROUS SULFATE 325 mg) once daily.  We will recheck your iron levels at your next visit.  **Seek immediate medical attention if you have any symptoms of recurrent blood clots in your legs or lungs.  (See attached handouts for more information on the symptoms).  ------------   Thank you for choosing [place] at [facility] to provide your oncology and hematology care.  To afford each patient quality time with our provider, please arrive at least 15 minutes before your scheduled appointment time.   If you have a lab appointment with the [facility] please come in thru the Main Entrance and check in at the main information desk.  You need to re-schedule your appointment should you arrive 10 or more minutes late.  We strive to give you quality time with our providers, and arriving late affects you and other patients whose appointments are after yours.  Also, if you no show three or more times for appointments you may be dismissed from the clinic at the providers discretion.     Again, thank you for choosing [facility].  Our hope is that these requests will decrease the amount of time that you wait before being seen by our physicians.       _____________________________________________________________  Should you have questions after your  visit to [facility], please contact our office at [phone] and follow the prompts.  Our office hours are 8:00 a.m. and 4:30 p.m. Monday - Friday.  Please note that voicemails left after 4:00 p.m. may not be returned until the following business day.  We are closed weekends and major holidays.  You do have access to a nurse 24-7, just call the main number to the clinic [phone] and do not press any options, hold on the line and a nurse will answer the phone.    For prescription refill requests, have your pharmacy contact our office and allow 72 hours.    Due to Covid, you will need to wear a mask upon entering the hospital. If you do not have a mask, a mask will be given to you at the Main Entrance upon arrival. For doctor visits, patients may have 1 support person age 32 or older with them. For treatment visits, patients can not have anyone with them due to social distancing guidelines and our immunocompromised population.

## 2021-08-10 ENCOUNTER — Other Ambulatory Visit: Payer: Self-pay | Admitting: Gastroenterology

## 2021-09-08 ENCOUNTER — Other Ambulatory Visit (INDEPENDENT_AMBULATORY_CARE_PROVIDER_SITE_OTHER): Payer: Medicaid Other

## 2021-09-08 ENCOUNTER — Encounter: Payer: Self-pay | Admitting: Gastroenterology

## 2021-09-08 ENCOUNTER — Ambulatory Visit: Payer: Medicaid Other | Admitting: Gastroenterology

## 2021-09-08 VITALS — BP 122/80 | HR 67 | Ht 70.0 in | Wt 250.4 lb

## 2021-09-08 DIAGNOSIS — K219 Gastro-esophageal reflux disease without esophagitis: Secondary | ICD-10-CM | POA: Insufficient documentation

## 2021-09-08 DIAGNOSIS — K259 Gastric ulcer, unspecified as acute or chronic, without hemorrhage or perforation: Secondary | ICD-10-CM

## 2021-09-08 DIAGNOSIS — K529 Noninfective gastroenteritis and colitis, unspecified: Secondary | ICD-10-CM | POA: Diagnosis not present

## 2021-09-08 DIAGNOSIS — K6289 Other specified diseases of anus and rectum: Secondary | ICD-10-CM | POA: Diagnosis not present

## 2021-09-08 DIAGNOSIS — K625 Hemorrhage of anus and rectum: Secondary | ICD-10-CM | POA: Diagnosis not present

## 2021-09-08 LAB — CBC WITH DIFFERENTIAL/PLATELET
Basophils Absolute: 0 10*3/uL (ref 0.0–0.1)
Basophils Relative: 0.7 % (ref 0.0–3.0)
Eosinophils Absolute: 0.2 10*3/uL (ref 0.0–0.7)
Eosinophils Relative: 3 % (ref 0.0–5.0)
HCT: 40.7 % (ref 39.0–52.0)
Hemoglobin: 14.1 g/dL (ref 13.0–17.0)
Lymphocytes Relative: 27.4 % (ref 12.0–46.0)
Lymphs Abs: 1.9 10*3/uL (ref 0.7–4.0)
MCHC: 34.6 g/dL (ref 30.0–36.0)
MCV: 85.5 fl (ref 78.0–100.0)
Monocytes Absolute: 0.4 10*3/uL (ref 0.1–1.0)
Monocytes Relative: 5.8 % (ref 3.0–12.0)
Neutro Abs: 4.3 10*3/uL (ref 1.4–7.7)
Neutrophils Relative %: 63.1 % (ref 43.0–77.0)
Platelets: 303 10*3/uL (ref 150.0–400.0)
RBC: 4.75 Mil/uL (ref 4.22–5.81)
RDW: 13.8 % (ref 11.5–15.5)
WBC: 6.8 10*3/uL (ref 4.0–10.5)

## 2021-09-08 LAB — BASIC METABOLIC PANEL
BUN: 16 mg/dL (ref 6–23)
CO2: 25 mEq/L (ref 19–32)
Calcium: 9.2 mg/dL (ref 8.4–10.5)
Chloride: 104 mEq/L (ref 96–112)
Creatinine, Ser: 0.86 mg/dL (ref 0.40–1.50)
GFR: 113.64 mL/min (ref >60.00)
Glucose, Bld: 92 mg/dL (ref 70–99)
Potassium: 3.6 mEq/L (ref 3.5–5.1)
Sodium: 137 mEq/L (ref 135–145)

## 2021-09-08 LAB — C-REACTIVE PROTEIN: CRP: <1 mg/dL (ref 0.5–20.0)

## 2021-09-08 LAB — SEDIMENTATION RATE: Sed Rate: 25 mm/hr — ABNORMAL HIGH (ref 0–15)

## 2021-09-08 MED ORDER — OMEPRAZOLE 40 MG PO CPDR: 40.0000 mg | DELAYED_RELEASE_CAPSULE | Freq: Every day | ORAL | 11 refills | Status: AC

## 2021-09-08 MED ORDER — NA SULFATE-K SULFATE-MG SULF 17.5-3.13-1.6 GM/177ML PO SOLN: 1.0000 | Freq: Once | ORAL | 0 refills | Status: AC

## 2021-09-08 NOTE — Patient Instructions (Signed)
Your provider has requested that you go to the basement level for lab work before leaving today. Press "B" on the elevator. The lab is located at the first door on the left as you exit the elevator.  Stop taking the mesalamine.   Decrease your omeprazole 40 mg to once daily. A new prescription has been sent to your pharmacy.   You have been scheduled for an endoscopy and colonoscopy. Please follow the written instructions given to you at your visit today. Please pick up your prep supplies at the pharmacy within the next 1-3 days. If you use inhalers (even only as needed), please bring them with you on the day of your procedure.  The [facility] providers would like to encourage you to use [facility] to communicate with providers for non-urgent requests or questions.  Due to long hold times on the telephone, sending your provider a message by [facility] may be a faster and more efficient way to get a response.  Please allow 48 business hours for a response.  Please remember that this is for non-urgent requests.   Due to recent changes in healthcare laws, you may see the results of your imaging and laboratory studies on MyChart before your provider has had a chance to review them.  We understand that in some cases there may be results that are confusing or concerning to you. Not all laboratory results come back in the same time frame and the provider may be waiting for multiple results in order to interpret others.  Please give [place] 48 hours in order for your provider to thoroughly review all the results before contacting the office for clarification of your results.

## 2021-09-08 NOTE — Progress Notes (Signed)
[place] VISIT   Primary Care Provider Health, [place] [facility] [address] [place]  [address] [phone]   Patient Profile: Mark Glenn is a 36 y.o. 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  The patient presents to the [facility] for an evaluation and management of problem(s) noted below:  Problem List 1. Chronic colitis   2. Proctitis   3. Rectal bleeding   4. Multiple gastric ulcers   5. Gastroesophageal reflux disease, unspecified whether esophagitis present      History of Present Illness Please see prior GI notes for full details of HPI.  Interval History The patient returns for a scheduled follow-up.  Patient states that after the initiation of mesalamine he did notice an improvement in his bowel habits.  He was noticing a little bit less blood in his stools but he never got to a point where he noticed no blood in the stools.  He was still waking up at night to have at least 1 bowel movement.  Urgency was not an issue and has not been an issue for quite a while.  As the patient noted persisting issues of blood in his stools and also some increased bloating, he stopped his FiberCon.  He also has run out of mesalamine and for the last 2 weeks has not taken any mesalamine.  Interestingly, the patient no longer is having any rectal bleeding at all.  He is having a soft and formed bowel movement for the last few days and feeling quite well.  He is in the process of trying to work on losing weight as per his cardiology and primary care providers who would like to see if he can come down on some of his blood pressure medications.  He has continued taking his PPI twice daily in 8 of healing his gastric ulcers.  He has missed days of taking twice daily and has  not felt any difference in regards to GERD symptoms.  Patient is not taking significant nonsteroidals or BC/Goody powders.  BMs per day -between 1 and 3 (mostly 1-2 now) Nocturnal BMs -nightly x1 Blood -last week none Mucous -none Tenesmus -none Urgency -none Skin Manifestations -no new rashes Eye Manifestations -no new issues (wears glasses regularly) Joint Manifestations -no new issues  GI Review of Systems Positive as above Negative for odynophagia, dysphagia, nausea, vomiting, pain, melena, hematochezia   Review of Systems General: Denies fevers/chills/weight loss unintentionally HEENT: Denies oral lesions Cardiovascular: Denies chest pain Pulmonary: Denies shortness of breath Gastroenterological: See HPI Genitourinary: Denies darkened urine Hematological: Denies bruising/bleeding Dermatological: Denies jaundice Psychological: Mood is stable   Medications Current Outpatient Medications  Medication Sig Dispense Refill   acetaminophen (TYLENOL) 325 MG tablet Take 2 tablets (650 mg total) by mouth every 6 (six) hours as needed for mild pain (or Fever >/= 101). 30 tablet 3   amLODipine (NORVASC) 10 MG tablet Take 1 tablet (10 mg total) by mouth daily. 30 tablet 5   Garlic 5498 MG CAPS Take 1 capsule by mouth daily.     hydrALAZINE (APRESOLINE) 50 MG tablet Take 1 tablet (50 mg total) by mouth 2 (two) times daily. 60 tablet 5   lisinopril-hydrochlorothiazide (ZESTORETIC) 20-12.5 MG tablet Take 1 tablet by mouth daily. 30 tablet 5   Multiple Vitamin (ONE-A-DAY MENS PO) Take 1 tablet by mouth daily.  Na Sulfate-K Sulfate-Mg Sulf 17.5-3.13-1.6 GM/177ML SOLN Take 1 kit by mouth once for 1 dose. 354 mL 0   ondansetron (ZOFRAN) 4 MG tablet Take 1 tablet (4 mg total) by mouth every 6 (six) hours. 12 tablet 0   potassium chloride (KLOR-CON) 10 MEQ tablet Take 1 tablet (10 mEq total) by mouth daily. Take While taking HCTZ 30 tablet 2   omeprazole (PRILOSEC) 40 MG capsule Take 1  capsule (40 mg total) by mouth daily. 30 capsule 11   No current facility-administered medications for this visit.    Allergies No Known Allergies  Histories Past Medical History:  Diagnosis Date   DVT of lower extremity (deep venous thrombosis) (HCC)    Hypertension    Kidney calculus    Past Surgical History:  Procedure Laterality Date   ANKLE SURGERY Right    MOUTH SURGERY     due to root canal/crown/abscess   Social History   Socioeconomic History   Marital status: Married    Spouse name: Not on file   Number of children: 4   Years of education: Not on file   Highest education level: Not on file  Occupational History   Occupation: Sales  Tobacco Use   Smoking status: Former    Packs/day: 0.50    Types: Cigarettes    Quit date: 12/20/2020    Years since quitting: 0.7   Smokeless tobacco: Never  Vaping Use   Vaping Use: Every day  Substance and Sexual Activity   Alcohol use: Not Currently   Drug use: Never   Sexual activity: Not on file  Other Topics Concern   Not on file  Social History Narrative   Not on file   Social Determinants of Health   Financial Resource Strain: Not on file  Food Insecurity: Not on file  Transportation Needs: Not on file  Physical Activity: Not on file  Stress: Not on file  Social Connections: Not on file  Intimate Partner Violence: Not on file   Family History  Problem Relation Age of Onset   Clotting disorder Mother    Heart failure Father    Clotting disorder Brother        x 2   Clotting disorder Maternal Grandmother    Hypertension Paternal Grandfather    Colon cancer Neg Hx    Stomach cancer Neg Hx    Esophageal cancer Neg Hx    Pancreatic cancer Neg Hx    Inflammatory bowel disease Neg Hx    Liver disease Neg Hx    Rectal cancer Neg Hx    I have reviewed his medical, social, and family history in detail and updated the electronic medical record as necessary.    PHYSICAL EXAMINATION  BP 122/80   Pulse 67    Ht _0  (1.778 m)   Wt 250 lb 6 oz (113.6 kg)   SpO2 98%   BMI 35.93 kg/m  Wt Readings from Last 3 Encounters:  09/08/21 250 lb 6 oz (113.6 kg)  07/26/21 252 lb 10.4 oz (114.6 kg)  07/04/21 250 lb (113.4 kg)  GEN: NAD, appears stated age, doesn't appear chronically ill PSYCH: Cooperative, without pressured speech EYE: Conjunctivae pink, sclerae anicteric ENT: MMM CV: Nontachycardic RESP: No audible wheezing GI: NABS, soft, protuberant abdomen, rounded, nontender, without rebound or guarding MSK/EXT: No lower extremity edema SKIN: No jaundice NEURO:  Alert & Oriented x 3, no focal deficits   REVIEW OF DATA  I reviewed the following data at the time  of this encounter:  GI Procedures and Studies  Previously reviewed  Laboratory Studies  Reviewed those in epic  Imaging Studies  No new studies to review   ASSESSMENT  Mr. Ardoin is a 36 y.o. 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  The patient is seen today for evaluation and management of:  1. Chronic colitis   2. Proctitis   3. Rectal bleeding   4. Multiple gastric ulcers   5. Gastroesophageal reflux disease, unspecified whether esophagitis present    The patient is clinically and hemodynamically stable at this time.  It is interesting that he noticed some improvement but not complete improvement while on the mesalamine therapy.  It is even more interesting that the patient is not having or experiencing any rectal bleeding at this point and he has been off mesalamine therapy for the last 2 weeks.  I am hopeful that the patient continues to do well over the course of the coming weeks and months.  With his evidence of chronic colitis on last colonoscopy I think now that he is off therapy I would like to see how he does and what his colon looks like in 4  to 6 weeks of being off therapy and see if chronic colitis is still present or not.  If it is not then the question of whether we need to consider restart of mesalamine therapy will be something we would have to consider.  In regards to the patient and his history of gastric ulcers, he seems to be doing well and at times has missed his double dosing.  I think it is reasonable for him to just go ahead and go down to once daily dosing of PPI and maintain that.  We will schedule the patient for an endoscopy at the same time as his colonoscopy to ensure healing of his gastric ulcer in follow-up of his chronic colitis.  The risks and benefits of endoscopic evaluation were discussed with the patient; these include but are not limited to the risk of perforation, infection, bleeding, missed lesions, lack of diagnosis, severe illness requiring hospitalization, as well as anesthesia and sedation related illnesses.  The patient and/or family is agreeable to proceed.  All patient questions were answered to the best of my ability, and the patient agrees to the aforementioned plan of action with follow-up as indicated.   PLAN  Laboratories as outlined below Fecal calprotectin to be obtained at patient's convenience Hold on restart of Lialda Okay to hold [place] for now Repeat EGD for surveillance to be scheduled at same time as colonoscopy Update [place] via MyChart if rectal bleeding recurs and may consider hemorrhoidal or proctitis therapies   Orders Placed This Encounter  Procedures   Calprotectin, Fecal   Calprotectin, Fecal   CBC with Differential/Platelet   Basic Metabolic Panel (BMET)   Sedimentation rate   C-reactive protein   Ambulatory referral to Gastroenterology    New Prescriptions   NA SULFATE-K SULFATE-MG SULF 17.5-3.13-1.6 GM/177ML SOLN    Take 1 kit by mouth once for 1 dose.   Modified Medications   Modified Medication Previous Medication   OMEPRAZOLE (PRILOSEC) 40 MG CAPSULE omeprazole  (PRILOSEC) 40 MG capsule      Take 1 capsule (40 mg total) by mouth daily.    Take 1 capsule (40 mg total) by mouth 2 (two) times daily.  Planned Follow Up No follow-ups on file.   Total Time in Face-to-Face and in Coordination of Care for patient including independent/personal interpretation/review of prior testing, medical history, examination, medication adjustment, communicating results with the patient directly, and documentation within the EHR is 25 minutes.  Justice Britain, MD [facility] Office # [phone]

## 2021-09-19 ENCOUNTER — Other Ambulatory Visit: Payer: Self-pay

## 2021-09-19 ENCOUNTER — Emergency Department (HOSPITAL_COMMUNITY)
Admission: EM | Admit: 2021-09-19 | Discharge: 2021-09-19 | Disposition: A | Discharge status: Home / Self Care | Payer: Medicaid Other | Attending: Emergency Medicine | Admitting: Emergency Medicine

## 2021-09-19 ENCOUNTER — Encounter (HOSPITAL_COMMUNITY): Payer: Self-pay | Admitting: Emergency Medicine

## 2021-09-19 ENCOUNTER — Ambulatory Visit
Admission: RE | Admit: 2021-09-19 | Discharge: 2021-09-19 | Disposition: A | Discharge status: Home / Self Care | Payer: Medicaid Other | Source: Ambulatory Visit | Attending: Nurse Practitioner | Admitting: Nurse Practitioner

## 2021-09-19 VITALS — BP 127/75 | HR 66 | Temp 98.1°F | Resp 18

## 2021-09-19 DIAGNOSIS — K0889 Other specified disorders of teeth and supporting structures: Secondary | ICD-10-CM

## 2021-09-19 DIAGNOSIS — K047 Periapical abscess without sinus: Secondary | ICD-10-CM | POA: Insufficient documentation

## 2021-09-19 MED ORDER — IBUPROFEN 800 MG PO TABS
800.0000 mg | ORAL_TABLET | Freq: Once | ORAL | Status: AC
  Administered 2021-09-19: 800 mg via ORAL
  Filled 2021-09-19: qty 1

## 2021-09-19 MED ORDER — AMOXICILLIN 250 MG PO CAPS
1000.0000 mg | ORAL_CAPSULE | Freq: Once | ORAL | Status: AC
  Administered 2021-09-19: 1000 mg via ORAL
  Filled 2021-09-19: qty 4

## 2021-09-19 MED ORDER — AMOXICILLIN 500 MG PO CAPS: 500.0000 mg | ORAL_CAPSULE | Freq: Three times a day (TID) | ORAL | 0 refills | Status: DC

## 2021-09-19 NOTE — ED Triage Notes (Signed)
Pt reports dental pain and dark red gum x 1 day. Pain is worse when eating, drinking or talking. Tylenol gives no relief.

## 2021-09-19 NOTE — ED Provider Notes (Signed)
[facility] EMERGENCY DEPARTMENT Provider Note   CSN: [id number] Arrival date & time: 09/19/21  1497     History  Chief Complaint  Patient presents with   Dental Pain    Mark Glenn is a 36 y.o. male.  Patient presents to the emergency department for evaluation of dental pain.  Patient reports pain on the right lower jaw with swelling of the gums.  Symptoms began around 6 PM last night.       Home Medications Prior to Admission medications   Medication Sig Start Date End Date Taking? Authorizing Provider  acetaminophen (TYLENOL) 325 MG tablet Take 2 tablets (650 mg total) by mouth every 6 (six) hours as needed for mild pain (or Fever >/= 101). 09/17/20   Roxan Hockey, MD  amLODipine (NORVASC) 10 MG tablet Take 1 tablet (10 mg total) by mouth daily. 09/18/20   Roxan Hockey, MD  Garlic 0263 MG CAPS Take 1 capsule by mouth daily.    [provider]  hydrALAZINE (APRESOLINE) 50 MG tablet Take 1 tablet (50 mg total) by mouth 2 (two) times daily. 09/17/20   Roxan Hockey, MD  lisinopril-hydrochlorothiazide (ZESTORETIC) 20-12.5 MG tablet Take 1 tablet by mouth daily. 09/17/20   Roxan Hockey, MD  Multiple Vitamin (ONE-A-DAY MENS PO) Take 1 tablet by mouth daily.    [provider]  omeprazole (PRILOSEC) 40 MG capsule Take 1 capsule (40 mg total) by mouth daily. 09/08/21   Mansouraty, Telford Nab., MD  ondansetron (ZOFRAN) 4 MG tablet Take 1 tablet (4 mg total) by mouth every 6 (six) hours. 01/13/21   Evalee Jefferson, PA-C  potassium chloride (KLOR-CON) 10 MEQ tablet Take 1 tablet (10 mEq total) by mouth daily. Take While taking HCTZ 09/17/20   Roxan Hockey, MD      Allergies    Patient has no known allergies.    Review of Systems   Review of Systems  Physical Exam Updated Vital Signs BP 136/77   Pulse (!) 57   Temp 98.3 F (36.8 C) (Oral)   Resp 18   Ht 5' 9"  (1.753 m)   Wt 108.9 kg   SpO2 99%   BMI 35.44 kg/m  Physical Exam Vitals and nursing  note reviewed.  Constitutional:      General: He is not in acute distress.    Appearance: He is well-developed.  HENT:     Head: Normocephalic and atraumatic.     Mouth/Throat:     Mouth: Mucous membranes are moist.   Eyes:     General: Vision grossly intact. Gaze aligned appropriately.     Extraocular Movements: Extraocular movements intact.     Conjunctiva/sclera: Conjunctivae normal.  Cardiovascular:     Rate and Rhythm: Normal rate and regular rhythm.     Pulses: Normal pulses.     Heart sounds: Normal heart sounds, S1 normal and S2 normal. No murmur heard.    No friction rub. No gallop.  Pulmonary:     Effort: Pulmonary effort is normal. No respiratory distress.     Breath sounds: Normal breath sounds.  Abdominal:     Palpations: Abdomen is soft.     Tenderness: There is no abdominal tenderness. There is no guarding or rebound.     Hernia: No hernia is present.  Musculoskeletal:        General: No swelling.     Cervical back: Full passive range of motion without pain, normal range of motion and neck supple. No pain with movement, spinous process  tenderness or muscular tenderness. Normal range of motion.     Right lower leg: No edema.     Left lower leg: No edema.  Skin:    General: Skin is warm and dry.     Capillary Refill: Capillary refill takes less than 2 seconds.     Findings: No ecchymosis, erythema, lesion or wound.  Neurological:     Mental Status: He is alert and oriented to person, place, and time.     GCS: GCS eye subscore is 4. GCS verbal subscore is 5. GCS motor subscore is 6.     Cranial Nerves: Cranial nerves 2-12 are intact.     Sensory: Sensation is intact.     Motor: Motor function is intact. No weakness or abnormal muscle tone.     Coordination: Coordination is intact.  Psychiatric:        Mood and Affect: Mood normal.        Speech: Speech normal.        Behavior: Behavior normal.     ED Results / Procedures / Treatments   Labs (all labs  ordered are listed, but only abnormal results are displayed) Labs Reviewed - No data to display  EKG None  Radiology No results found.  Procedures Procedures    Medications Ordered in ED Medications  ibuprofen (ADVIL) tablet 800 mg (has no administration in time range)  amoxicillin (AMOXIL) capsule 1,000 mg (has no administration in time range)    ED Course/ Medical Decision Making/ A&P                           Medical Decision Making  Patient with swelling adjacent to tooth #30.  Area is tender but nonfluctuant.  No drainable abscess noted.  No facial swelling, no fever.  Vitals unremarkable.  Treat for early dental abscess.  Given return precautions.  Follow-up with dentist.        Final Clinical Impression(s) / ED Diagnoses Final diagnoses:  Pain, dental  Dental abscess    Rx / DC Orders ED Discharge Orders     None         Shivank Pinedo, Gwenyth Allegra, MD 09/19/21 [phone]

## 2021-09-19 NOTE — ED Provider Notes (Signed)
[facility]    CSN: [id number] Arrival date & time: 09/19/21  1046      History   Chief Complaint Chief Complaint  Patient presents with   Abscess    Entered by patient   Dental Pain    HPI Mark Glenn is a 36 y.o. male.   The history is provided by the patient.   Patient presents for dental pain in the right lower portion of his mouth that started around 6 PM yesterday.  Patient reports previous history of the tooth being broken, states it would happen quite sometime ago but has not bothered him since that time.  Patient was discharged from the emergency department today with the same or similar complaint.  Patient states when he was seen in the emergency department, the doctor told him he was going to prescribe an antibiotic but when he went to pick it up, there was nothing at the pharmacy.  Patient has not contacted the emergency department at this time, but came to this clinic for further evaluation.  Patient states dental pain has worsened since it started last evening.  He complains of pain with swallowing, talking, and chewing.  Patient states he became concerned when his gums started to change colors.  He denies fever, chills, abdominal pain, chest pain, nausea, vomiting, or diarrhea.  Patient states he was given a Clinical research associate guide by the emergency department for follow-up.  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   On continuous oral anticoagulation 03/16/2021   Abdominal pain 03/16/2021   Multiple gastric ulcers 03/16/2021   Chronic colitis 03/16/2021   Abdominal pain, epigastric 01/17/2021   Nausea and vomiting 01/17/2021   Diarrhea 01/17/2021   Rectal bleeding 01/17/2021   Chronic heart failure with preserved ejection fraction (HFpEF)  /chronic diastolic dysfunction CHF 02/63/7858   HTN (hypertension) 09/17/2020   Obesity (BMI 30.0-34.9) 09/17/2020   Hypertensive urgency 09/16/2020   Nicotine dependence 09/16/2020   Hypokalemia 09/16/2020   Chest pain 09/16/2020    Past Surgical History:  Procedure Laterality Date   ANKLE SURGERY Right    MOUTH SURGERY     due to root canal/crown/abscess       Home Medications    Prior to Admission medications   Medication Sig Start Date End Date Taking? Authorizing Provider  amoxicillin (AMOXIL) 500 MG capsule Take 1 capsule (500 mg total) by mouth 3 (three) times daily. 09/19/21  Yes Travonne Schowalter-Warren, Alda Lea, NP  acetaminophen (TYLENOL) 325 MG tablet Take 2 tablets (650 mg total) by mouth every 6 (six) hours as needed for mild pain (or Fever >/= 101). 09/17/20   Roxan Hockey, MD  amLODipine (NORVASC) 10 MG tablet Take 1 tablet (10 mg total) by mouth daily. 09/18/20   Roxan Hockey, MD  Garlic 8502 MG CAPS Take 1 capsule by mouth daily.    [provider]  hydrALAZINE (APRESOLINE) 50 MG tablet Take 1 tablet (50 mg total) by mouth 2 (two) times daily. 09/17/20   Roxan Hockey, MD  lisinopril-hydrochlorothiazide (ZESTORETIC) 20-12.5 MG tablet Take 1 tablet by mouth daily. 09/17/20   Roxan Hockey, MD  Multiple Vitamin (ONE-A-DAY MENS PO) Take 1 tablet by mouth daily.    [provider]  omeprazole (PRILOSEC) 40 MG capsule Take  1 capsule (40 mg total) by mouth daily. 09/08/21   Mansouraty, Telford Nab., MD  ondansetron (ZOFRAN) 4 MG tablet Take 1 tablet (4 mg total) by mouth every 6 (six) hours. 01/13/21   Evalee Jefferson, PA-C  potassium chloride (KLOR-CON) 10 MEQ tablet Take 1 tablet (10 mEq total) by mouth daily. Take While taking HCTZ 09/17/20   Roxan Hockey, MD    Family History Family History  Problem Relation Age of Onset   Clotting disorder Mother    Heart failure Father    Clotting disorder Brother        x 2   Clotting disorder Maternal  Grandmother    Hypertension Paternal Grandfather    Colon cancer Neg Hx    Stomach cancer Neg Hx    Esophageal cancer Neg Hx    Pancreatic cancer Neg Hx    Inflammatory bowel disease Neg Hx    Liver disease Neg Hx    Rectal cancer Neg Hx     Social History Social History   Tobacco Use   Smoking status: Former    Packs/day: 0.50    Types: Cigarettes    Quit date: 12/20/2020    Years since quitting: 0.7   Smokeless tobacco: Never  Vaping Use   Vaping Use: Every day  Substance Use Topics   Alcohol use: Not Currently   Drug use: Never     Allergies   Patient has no known allergies.   Review of Systems Review of Systems Per HPI  Physical Exam Triage Vital Signs ED Triage Vitals  Enc Vitals Group     BP 09/19/21 1058 127/75     Pulse Rate 09/19/21 1058 66     Resp 09/19/21 1058 18     Temp 09/19/21 1058 98.1 F (36.7 C)     Temp Source 09/19/21 1058 Oral     SpO2 09/19/21 1058 96 %     Weight --      Height --      Head Circumference --      Peak Flow --      Pain Score 09/19/21 1057 6     Pain Loc --      Pain Edu? --      Excl. in [place]? --    No data found.  Updated Vital Signs BP 127/75 (BP Location: Right Arm)   Pulse 66   Temp 98.1 F (36.7 C) (Oral)   Resp 18   SpO2 96%   Visual Acuity Right Eye Distance:   Left Eye Distance:   Bilateral Distance:    Right Eye Near:   Left Eye Near:    Bilateral Near:     Physical Exam Vitals reviewed.  Constitutional:      Appearance: Normal appearance.  HENT:     Head: Normocephalic.     Mouth/Throat:     Lips: Pink.     Mouth: Mucous membranes are moist.     Dentition: Dental tenderness and gingival swelling present. No dental abscesses.     Comments: Tenderness to tooth #30.  Gingival swelling is present.  No abscess is present at this time.  No fluctuance, drainage, or oozing are present. Eyes:     Extraocular Movements: Extraocular movements intact.     Pupils: Pupils are equal, round, and  reactive to light.  Cardiovascular:     Rate and Rhythm: Normal rate.  Skin:    General: Skin is warm and dry.  Neurological:     General: No  focal deficit present.     Mental Status: He is alert and oriented to person, place, and time.  Psychiatric:        Mood and Affect: Mood normal.      UC Treatments / Results  Labs (all labs ordered are listed, but only abnormal results are displayed) Labs Reviewed - No data to display  EKG   Radiology No results found.  Procedures Procedures (including critical care time)  Medications Ordered in UC Medications - No data to display  Initial Impression / Assessment and Plan / UC Course  I have reviewed the triage vital signs and the nursing notes.  Pertinent labs & imaging results that were available during my care of the patient were reviewed by me and considered in my medical decision making (see chart for details).  Presents for complaints of dental pain to tooth #30 in addition to gingival swelling and tenderness.  Patient was seen in the emergency department today for the same or similar symptoms.  Patient states he was not prescribed amoxicillin as he was told by the ER physician.  Patient was prescribed amoxicillin for 7 days.  Patient was referred back to the dental resource guide given to him from the emergency department.  Supportive care recommendations were provided to the patient.  Patient advised to follow-up as needed. Final Clinical Impressions(s) / UC Diagnoses   Final diagnoses:  Dentalgia     Discharge Instructions         Take medication as prescribed. May take over-the-counter Tylenol extra strength 1 to 2 hours after ibuprofen for breakthrough pain. Warm salt water gargles 3-4 times daily until symptoms improve. Continue warm compresses to the affected area to help with pain and discomfort. I have provided a dental resource guide for you to follow-up with a dentist.  Recommend following up within the  next 7 to 10 days.      ED Prescriptions     Medication Sig Dispense Auth. Provider   amoxicillin (AMOXIL) 500 MG capsule Take 1 capsule (500 mg total) by mouth 3 (three) times daily. 21 capsule Rani Sisney-Warren, Alda Lea, NP      PDMP not reviewed this encounter.   Tish Men, NP 09/19/21 1121

## 2021-09-19 NOTE — Discharge Instructions (Addendum)
  Take medication as prescribed. May take over-the-counter Tylenol extra strength 1 to 2 hours after ibuprofen for breakthrough pain. Warm salt water gargles 3-4 times daily until symptoms improve. Continue warm compresses to the affected area to help with pain and discomfort. I have provided a dental resource guide for you to follow-up with a dentist.  Recommend following up within the next 7 to 10 days.

## 2021-09-19 NOTE — ED Triage Notes (Signed)
Pt c/o dental pain since 1800. Pt states he has taken tylenol and oragel with no relief

## 2021-10-01 ENCOUNTER — Emergency Department (HOSPITAL_COMMUNITY)
Admission: EM | Admit: 2021-10-01 | Discharge: 2021-10-01 | Disposition: A | Discharge status: Home / Self Care | Payer: Medicaid Other | Attending: Emergency Medicine | Admitting: Emergency Medicine

## 2021-10-01 ENCOUNTER — Other Ambulatory Visit: Payer: Self-pay

## 2021-10-01 ENCOUNTER — Encounter (HOSPITAL_COMMUNITY): Payer: Self-pay

## 2021-10-01 ENCOUNTER — Emergency Department (HOSPITAL_COMMUNITY)
Admission: EM | Admit: 2021-10-01 | Discharge: 2021-10-01 | Disposition: A | Discharge status: Home / Self Care | Payer: Medicaid Other | Source: Home / Self Care | Attending: Emergency Medicine | Admitting: Emergency Medicine

## 2021-10-01 DIAGNOSIS — M79642 Pain in left hand: Secondary | ICD-10-CM | POA: Diagnosis present

## 2021-10-01 DIAGNOSIS — I10 Essential (primary) hypertension: Secondary | ICD-10-CM | POA: Insufficient documentation

## 2021-10-01 DIAGNOSIS — M79641 Pain in right hand: Secondary | ICD-10-CM | POA: Insufficient documentation

## 2021-10-01 DIAGNOSIS — R7309 Other abnormal glucose: Secondary | ICD-10-CM | POA: Insufficient documentation

## 2021-10-01 DIAGNOSIS — M7989 Other specified soft tissue disorders: Secondary | ICD-10-CM | POA: Insufficient documentation

## 2021-10-01 DIAGNOSIS — Z79899 Other long term (current) drug therapy: Secondary | ICD-10-CM | POA: Diagnosis not present

## 2021-10-01 LAB — COMPREHENSIVE METABOLIC PANEL
ALT: 48 U/L — ABNORMAL HIGH (ref 0–44)
AST: 29 U/L (ref 15–41)
Albumin: 4.5 g/dL (ref 3.5–5.0)
Alkaline Phosphatase: 57 U/L (ref 38–126)
Anion gap: 7 (ref 5–15)
BUN: 12 mg/dL (ref 6–20)
CO2: 26 mmol/L (ref 22–32)
Calcium: 9.1 mg/dL (ref 8.9–10.3)
Chloride: 106 mmol/L (ref 98–111)
Creatinine, Ser: 1 mg/dL (ref 0.61–1.24)
GFR, Estimated: >60 mL/min (ref >60)
Glucose, Bld: 127 mg/dL — ABNORMAL HIGH (ref 70–99)
Potassium: 3.4 mmol/L — ABNORMAL LOW (ref 3.5–5.1)
Sodium: 139 mmol/L (ref 135–145)
Total Bilirubin: 0.5 mg/dL (ref 0.3–1.2)
Total Protein: 7.7 g/dL (ref 6.5–8.1)

## 2021-10-01 LAB — CBC WITH DIFFERENTIAL/PLATELET
Abs Immature Granulocytes: 0.02 10*3/uL (ref 0.00–0.07)
Basophils Absolute: 0.1 10*3/uL (ref 0.0–0.1)
Basophils Relative: 1 %
Eosinophils Absolute: 0.2 10*3/uL (ref 0.0–0.5)
Eosinophils Relative: 3 %
HCT: 38.7 % — ABNORMAL LOW (ref 39.0–52.0)
Hemoglobin: 13.6 g/dL (ref 13.0–17.0)
Immature Granulocytes: 0 %
Lymphocytes Relative: 29 %
Lymphs Abs: 2.7 10*3/uL (ref 0.7–4.0)
MCH: 30.2 pg (ref 26.0–34.0)
MCHC: 35.1 g/dL (ref 30.0–36.0)
MCV: 86 fL (ref 80.0–100.0)
Monocytes Absolute: 0.5 10*3/uL (ref 0.1–1.0)
Monocytes Relative: 5 %
Neutro Abs: 5.9 10*3/uL (ref 1.7–7.7)
Neutrophils Relative %: 62 %
Platelets: 294 10*3/uL (ref 150–400)
RBC: 4.5 MIL/uL (ref 4.22–5.81)
RDW: 13.3 % (ref 11.5–15.5)
WBC: 9.3 10*3/uL (ref 4.0–10.5)
nRBC: 0 % (ref 0.0–0.2)

## 2021-10-01 LAB — C-REACTIVE PROTEIN: CRP: 1.8 mg/dL — ABNORMAL HIGH (ref <1.0)

## 2021-10-01 LAB — SEDIMENTATION RATE: Sed Rate: 13 mm/hr (ref 0–16)

## 2021-10-01 LAB — CBG MONITORING, ED: Glucose-Capillary: 90 mg/dL (ref 70–99)

## 2021-10-01 MED ORDER — DEXAMETHASONE SODIUM PHOSPHATE 10 MG/ML IJ SOLN
10.0000 mg | Freq: Once | INTRAMUSCULAR | Status: AC
  Administered 2021-10-01: 10 mg via INTRAMUSCULAR
  Filled 2021-10-01: qty 1

## 2021-10-01 MED ORDER — PREDNISONE 10 MG PO TABS: 20.0000 mg | ORAL_TABLET | Freq: Every day | ORAL | 0 refills | Status: DC

## 2021-10-01 NOTE — Discharge Instructions (Signed)
Blood cultures and some other blood work is still pending.  You will be notified if it is positive.  I gave you a prescription for some steroids.  Call GI to see if it would change your colonoscopy.

## 2021-10-01 NOTE — Discharge Instructions (Signed)
You have been given an injection of steroids today.  Start the prescription prednisone tomorrow.  As discussed, please call your GI provider tomorrow to let them know that you are currently on steroids as this may change your upcoming procedure appointment.  Follow-up with your primary care provider for recheck.

## 2021-10-01 NOTE — ED Triage Notes (Signed)
Pt reports bilateral hand swelling, states it started with just two fingers on his right hand and has progressed throughout both hands. C/o pain when trying to make a fist as well as knots on his forearms. Denies any injury.

## 2021-10-01 NOTE — ED Triage Notes (Addendum)
Pt to er, pt states that he is here for the same thing as earlier today.  States that he is here because the swelling in his hands is worse.  Pt denies sob or tongue and throat swelling. Resps even and unlabored, talking in full sentences.

## 2021-10-01 NOTE — ED Provider Notes (Signed)
[facility] EMERGENCY DEPARTMENT Provider Note   CSN: [id number] Arrival date & time: 10/01/21  0245     History  Chief Complaint  Patient presents with   Hand Problem    Swelling     Mark Glenn is a 36 y.o. male.  HPI Patient presents with hand pain.  Began on right hand and now is on both hands.  More redness and swelling.  No fevers.  States there is redness that was not there before.  Worse with certain movements.  History of inflammatory bowel disease of some kind.  Also had DVT in his leg with COVID.  Due to have a new colonoscopy.  Not on immunosuppression for the inflammatory bowel disease.  No cough.  Did have recent dental infection and has been on antibiotics   Past Medical History:  Diagnosis Date   DVT of lower extremity (deep venous thrombosis) ([place])    Hypertension    Kidney calculus     Home Medications Prior to Admission medications   Medication Sig Start Date End Date Taking? Authorizing Provider  predniSONE (DELTASONE) 10 MG tablet Take 2 tablets (20 mg total) by mouth daily. 10/01/21  Yes Davonna Belling, MD  acetaminophen (TYLENOL) 325 MG tablet Take 2 tablets (650 mg total) by mouth every 6 (six) hours as needed for mild pain (or Fever >/= 101). 09/17/20   Roxan Hockey, MD  amLODipine (NORVASC) 10 MG tablet Take 1 tablet (10 mg total) by mouth daily. 09/18/20   Roxan Hockey, MD  amoxicillin (AMOXIL) 500 MG capsule Take 1 capsule (500 mg total) by mouth 3 (three) times daily. 09/19/21   Leath-Warren, Alda Lea, NP  Garlic 9326 MG CAPS Take 1 capsule by mouth daily.    [provider]  hydrALAZINE (APRESOLINE) 50 MG tablet Take 1 tablet (50 mg total) by mouth 2 (two) times daily. 09/17/20   Roxan Hockey, MD  lisinopril-hydrochlorothiazide (ZESTORETIC) 20-12.5 MG tablet Take 1 tablet by mouth daily. 09/17/20   Roxan Hockey, MD  Multiple Vitamin (ONE-A-DAY MENS PO) Take 1 tablet by mouth daily.    [provider]  omeprazole  (PRILOSEC) 40 MG capsule Take 1 capsule (40 mg total) by mouth daily. 09/08/21   Mansouraty, Telford Nab., MD  ondansetron (ZOFRAN) 4 MG tablet Take 1 tablet (4 mg total) by mouth every 6 (six) hours. 01/13/21   Evalee Jefferson, PA-C  potassium chloride (KLOR-CON) 10 MEQ tablet Take 1 tablet (10 mEq total) by mouth daily. Take While taking HCTZ 09/17/20   Roxan Hockey, MD      Allergies    Patient has no known allergies.    Review of Systems   Review of Systems  Physical Exam Updated Vital Signs BP 130/77   Pulse 92   Temp 98.3 F (36.8 C) (Oral)   Resp 18   Ht 5' 9"  (1.753 m)   Wt 109 kg   SpO2 98%   BMI 35.49 kg/m  Physical Exam Vitals and nursing note reviewed.  Eyes:     Pupils: Pupils are equal, round, and reactive to light.  Cardiovascular:     Rate and Rhythm: Regular rhythm.     Heart sounds: No murmur heard. Pulmonary:     Breath sounds: Normal breath sounds.  Abdominal:     Tenderness: There is no abdominal tenderness.  Musculoskeletal:        General: Tenderness present.     Cervical back: Neck supple.     Comments: Erythema and some tenderness over  various fingers.  Mostly on the palmar aspect and on the pads somewhat proximally.  Does have a separate circular lesion on the medial palm of the right hand.  Also some on the dorsal right hand.  Skin:    Capillary Refill: Capillary refill takes less than 2 seconds.  Neurological:     Mental Status: He is alert and oriented to person, place, and time.          ED Results / Procedures / Treatments   Labs (all labs ordered are listed, but only abnormal results are displayed) Labs Reviewed  COMPREHENSIVE METABOLIC PANEL - Abnormal; Notable for the following components:      Result Value   Potassium 3.4 (*)    Glucose, Bld 127 (*)    ALT 48 (*)    All other components within normal limits  CBC WITH DIFFERENTIAL/PLATELET - Abnormal; Notable for the following components:   HCT 38.7 (*)    All other  components within normal limits  CULTURE, BLOOD (ROUTINE X 2)  CULTURE, BLOOD (ROUTINE X 2)  SEDIMENTATION RATE  C-REACTIVE PROTEIN  RPR  ROCKY MTN SPOTTED FVR ABS PNL(IGG+IGM)    EKG None  Radiology No results found.  Procedures Procedures    Medications Ordered in ED Medications - No data to display  ED Course/ Medical Decision Making/ A&P                           Medical Decision Making Amount and/or Complexity of Data Reviewed Labs: ordered.  Risk Prescription drug management.   Patient with redness swelling and pain in bilateral hands.  No fevers.  Pain worse with movement.  Vasculitis considered particularly since patient does already have inflammatory bowel disease.  Also on the differential diagnosis is endocarditis with embolization.  Has had recent dental infection.  Also with hand lesions will check RPR and RMSF labs.  Patient has normal white count and a normal sed rate.  This does make endocarditis felt less likely.  The lesions do blanch also.  [facility] spotted fever and RPR will not come back while he is here.  We will give short course of steroids to help with potential inflammation.  Severe vasculitis also felt less likely.  However patient will discuss with gastroenterology to see if it would affect his colonoscopy that is scheduled.  Will discharge home.        Final Clinical Impression(s) / ED Diagnoses Final diagnoses:  Pain in both hands    Rx / DC Orders ED Discharge Orders          Ordered    predniSONE (DELTASONE) 10 MG tablet  Daily        10/01/21 0504              Davonna Belling, MD 10/01/21 [phone]

## 2021-10-02 ENCOUNTER — Telehealth: Payer: Self-pay | Admitting: Gastroenterology

## 2021-10-02 LAB — RPR: RPR Ser Ql: NONREACTIVE

## 2021-10-02 NOTE — Telephone Encounter (Signed)
I have no contraindication to the patient continuing his steroids if they were deemed necessary. They do not impact his upcoming colonoscopy. If he feels he needs time to reschedule, then please work on rescheduling next available if he desires. Thanks. GM

## 2021-10-02 NOTE — Telephone Encounter (Signed)
PT was recently hospitalized and put on steroids. Has a colonoscopy scheduled for 10/10/2021. Wants to know if he will still be able to have procedure done while on them. Please advise. Thank you.

## 2021-10-02 NOTE — Telephone Encounter (Signed)
The pt had swelling in the hands and was given steroids and wants to make sure he can continue with colon as planned.  Dr Rush Landmark please review recent ED note and make recommendations.

## 2021-10-02 NOTE — Telephone Encounter (Signed)
The pt has been advised that he can proceed with colon as planned.  He prefers to have this as scheduled.

## 2021-10-03 ENCOUNTER — Encounter: Payer: Self-pay | Admitting: Gastroenterology

## 2021-10-03 LAB — ROCKY MTN SPOTTED FVR ABS PNL(IGG+IGM)
RMSF IgG: NEGATIVE
RMSF IgM: 0.38 index (ref 0.00–0.89)

## 2021-10-03 NOTE — ED Provider Notes (Signed)
[facility] EMERGENCY DEPARTMENT Provider Note   CSN: [id number] Arrival date & time: 10/01/21  1245     History  Chief Complaint  Patient presents with   Allergic Reaction    Mark Glenn is a 36 y.o. male.   Allergic Reaction Presenting symptoms: rash         Mark Glenn is a 36 y.o. male who presents to the Emergency Department complaining of increased redness and swelling of both hands.  He was seen here earlier this morning and treated for similar symptoms.  States he was prescribed a prednisone, but has not gotten his prescription filled.  He has noticed swelling that has worsened at the base of the fingers of the both hands with right greater than left.  He denies any fever, chills, numbness of his hands, known allergic reaction, swelling of his face lips or tongue.  No shortness of breath.  No known tick bites.    Home Medications Prior to Admission medications   Medication Sig Start Date End Date Taking? Authorizing Provider  acetaminophen (TYLENOL) 325 MG tablet Take 2 tablets (650 mg total) by mouth every 6 (six) hours as needed for mild pain (or Fever >/= 101). 09/17/20   Roxan Hockey, MD  amLODipine (NORVASC) 10 MG tablet Take 1 tablet (10 mg total) by mouth daily. 09/18/20   Roxan Hockey, MD  amoxicillin (AMOXIL) 500 MG capsule Take 1 capsule (500 mg total) by mouth 3 (three) times daily. 09/19/21   Leath-Warren, Alda Lea, NP  Garlic 4098 MG CAPS Take 1 capsule by mouth daily.    [provider]  hydrALAZINE (APRESOLINE) 50 MG tablet Take 1 tablet (50 mg total) by mouth 2 (two) times daily. 09/17/20   Roxan Hockey, MD  lisinopril-hydrochlorothiazide (ZESTORETIC) 20-12.5 MG tablet Take 1 tablet by mouth daily. 09/17/20   Roxan Hockey, MD  Multiple Vitamin (ONE-A-DAY MENS PO) Take 1 tablet by mouth daily.    [provider]  omeprazole (PRILOSEC) 40 MG capsule Take 1 capsule (40 mg total) by mouth daily. 09/08/21   Mansouraty, Telford Nab., MD  ondansetron (ZOFRAN) 4 MG tablet Take 1 tablet (4 mg total) by mouth every 6 (six) hours. 01/13/21   Evalee Jefferson, PA-C  potassium chloride (KLOR-CON) 10 MEQ tablet Take 1 tablet (10 mEq total) by mouth daily. Take While taking HCTZ 09/17/20   Roxan Hockey, MD  predniSONE (DELTASONE) 10 MG tablet Take 2 tablets (20 mg total) by mouth daily. 10/01/21   Davonna Belling, MD      Allergies    Patient has no known allergies.    Review of Systems   Review of Systems  Constitutional:  Negative for appetite change and fever.  Respiratory:  Negative for cough, chest tightness and shortness of breath.   Cardiovascular:  Negative for chest pain and leg swelling.  Gastrointestinal:  Negative for diarrhea, nausea and vomiting.  Musculoskeletal:  Positive for arthralgias and joint swelling.  Skin:  Positive for color change and rash.  Neurological:  Negative for weakness and numbness.    Physical Exam Updated Vital Signs BP 137/88 (BP Location: Left Arm)   Pulse 63   Temp 98.1 F (36.7 C) (Oral)   Resp 18   Ht 5' 9"  (1.753 m)   Wt 108.9 kg   SpO2 98%   BMI 35.44 kg/m  Physical Exam Vitals and nursing note reviewed.  Constitutional:      General: He is not in acute distress.    Appearance: Normal  appearance. He is not toxic-appearing.  HENT:     Mouth/Throat:     Mouth: Mucous membranes are moist.     Pharynx: Oropharynx is clear.     Comments: No edema of the face lips or tongue.  Uvula midline nonedematous. Cardiovascular:     Rate and Rhythm: Normal rate and regular rhythm.     Pulses: Normal pulses.  Pulmonary:     Effort: Pulmonary effort is normal.  Musculoskeletal:        General: No tenderness. Normal range of motion.  Skin:    General: Skin is warm.     Capillary Refill: Capillary refill takes less than 2 seconds.     Findings: Erythema and rash present.     Comments: Patchy erythema of the bilateral hands, there is some edema noted at the base of the  fingers of the right hand.  Some macular lesions noted to the palmar aspect as well.  No vesicles or pustules.  No target lesions.  There is some localized erythema of the left elbow as well but patient states was not there this morning.  See attached photos  Neurological:     General: No focal deficit present.     Mental Status: He is alert.     Sensory: No sensory deficit.     Motor: No weakness.          ED Results / Procedures / Treatments   Labs (all labs ordered are listed, but only abnormal results are displayed) Labs Reviewed  CBG MONITORING, ED    EKG None  Radiology No results found.  Procedures Procedures    Medications Ordered in ED Medications  dexamethasone (DECADRON) injection 10 mg (10 mg Intramuscular Given 10/01/21 1811)    ED Course/ Medical Decision Making/ A&P                           Medical Decision Making Patient seen here this morning for similar symptoms.  Returns stating that his redness and swelling of his hands is worse.  He was prescribed prednisone but has not started the medication yet.  Denies any known allergic reactions, tick bites, or recent illness.  On exam, patient well-appearing nontoxic.  No edema of the face lips or tongue.  He does have some erythema of the lateral left elbow, bilateral hands including palmar surfaces.  Patient was seen here this morning and had work-up that included labs.  His CRP and [place] spotted fever titers are pending.  No evidence of leukocytosis.  I do not feel that additional labs are warranted at this time.  Patient has some increased edema to his fingers, but has not started prednisone.  Source of patient's symptoms are unclear at this time.  This could represent STI, vasculitis, allergic reaction, insect bites.  Apparently there was some concern about an upcoming GI procedure.  Patient states that he can call to reschedule this and prefers to start treatment with steroids.  IM Decadron given  here and patient will get his prednisone filled.  Feel that he is appropriate for discharge home.  Return precautions discussed.  Risk Prescription drug management.           Final Clinical Impression(s) / ED Diagnoses Final diagnoses:  Pain in both hands    Rx / DC Orders ED Discharge Orders     None         Kem Parkinson, PA-C 10/03/21 1643  Dorie Rank, MD 10/04/21 [phone]

## 2021-10-04 ENCOUNTER — Inpatient Hospital Stay: Payer: Medicaid Other | Attending: Physician Assistant

## 2021-10-04 DIAGNOSIS — Z79899 Other long term (current) drug therapy: Secondary | ICD-10-CM | POA: Diagnosis not present

## 2021-10-04 DIAGNOSIS — Z86718 Personal history of other venous thrombosis and embolism: Secondary | ICD-10-CM | POA: Diagnosis present

## 2021-10-04 DIAGNOSIS — R0609 Other forms of dyspnea: Secondary | ICD-10-CM | POA: Insufficient documentation

## 2021-10-04 DIAGNOSIS — Z7901 Long term (current) use of anticoagulants: Secondary | ICD-10-CM | POA: Insufficient documentation

## 2021-10-04 DIAGNOSIS — I82432 Acute embolism and thrombosis of left popliteal vein: Secondary | ICD-10-CM

## 2021-10-04 DIAGNOSIS — Z87891 Personal history of nicotine dependence: Secondary | ICD-10-CM | POA: Insufficient documentation

## 2021-10-04 DIAGNOSIS — R5383 Other fatigue: Secondary | ICD-10-CM | POA: Insufficient documentation

## 2021-10-04 DIAGNOSIS — E611 Iron deficiency: Secondary | ICD-10-CM

## 2021-10-04 LAB — CBC WITH DIFFERENTIAL/PLATELET
Abs Immature Granulocytes: 0.04 10*3/uL (ref 0.00–0.07)
Basophils Absolute: 0.1 10*3/uL (ref 0.0–0.1)
Basophils Relative: 0 %
Eosinophils Absolute: 0.3 10*3/uL (ref 0.0–0.5)
Eosinophils Relative: 2 %
HCT: 38.4 % — ABNORMAL LOW (ref 39.0–52.0)
Hemoglobin: 13.3 g/dL (ref 13.0–17.0)
Immature Granulocytes: 0 %
Lymphocytes Relative: 42 %
Lymphs Abs: 4.9 10*3/uL — ABNORMAL HIGH (ref 0.7–4.0)
MCH: 30.4 pg (ref 26.0–34.0)
MCHC: 34.6 g/dL (ref 30.0–36.0)
MCV: 87.9 fL (ref 80.0–100.0)
Monocytes Absolute: 0.6 10*3/uL (ref 0.1–1.0)
Monocytes Relative: 5 %
Neutro Abs: 5.9 10*3/uL (ref 1.7–7.7)
Neutrophils Relative %: 51 %
Platelets: 329 10*3/uL (ref 150–400)
RBC: 4.37 MIL/uL (ref 4.22–5.81)
RDW: 13.7 % (ref 11.5–15.5)
WBC: 11.7 10*3/uL — ABNORMAL HIGH (ref 4.0–10.5)
nRBC: 0 % (ref 0.0–0.2)

## 2021-10-04 LAB — IRON AND TIBC
Iron: 54 ug/dL (ref 45–182)
Saturation Ratios: 16 % — ABNORMAL LOW (ref 17.9–39.5)
TIBC: 337 ug/dL (ref 250–450)
UIBC: 283 ug/dL

## 2021-10-04 LAB — ANTITHROMBIN III: AntiThromb III Func: 55 % — ABNORMAL LOW (ref 75–120)

## 2021-10-04 LAB — FERRITIN: Ferritin: 22 ng/mL — ABNORMAL LOW (ref 24–336)

## 2021-10-04 LAB — D-DIMER, QUANTITATIVE: D-Dimer, Quant: 0.46 ug/mL-FEU (ref 0.00–0.50)

## 2021-10-06 ENCOUNTER — Other Ambulatory Visit: Payer: Self-pay | Admitting: Physician Assistant

## 2021-10-06 DIAGNOSIS — I82432 Acute embolism and thrombosis of left popliteal vein: Secondary | ICD-10-CM

## 2021-10-07 LAB — CULTURE, BLOOD (ROUTINE X 2)
Culture: NO GROWTH
Culture: NO GROWTH
Special Requests: ADEQUATE
Special Requests: ADEQUATE

## 2021-10-09 ENCOUNTER — Inpatient Hospital Stay: Payer: Medicaid Other

## 2021-10-09 DIAGNOSIS — Z86718 Personal history of other venous thrombosis and embolism: Secondary | ICD-10-CM | POA: Diagnosis not present

## 2021-10-09 DIAGNOSIS — I82432 Acute embolism and thrombosis of left popliteal vein: Secondary | ICD-10-CM

## 2021-10-10 ENCOUNTER — Ambulatory Visit (AMBULATORY_SURGERY_CENTER): Payer: Medicaid Other | Admitting: Gastroenterology

## 2021-10-10 ENCOUNTER — Other Ambulatory Visit: Payer: Medicaid Other

## 2021-10-10 ENCOUNTER — Encounter: Payer: Self-pay | Admitting: Gastroenterology

## 2021-10-10 VITALS — BP 124/75 | HR 66 | Temp 99.1°F | Resp 19 | Ht 70.0 in | Wt 250.0 lb

## 2021-10-10 DIAGNOSIS — K259 Gastric ulcer, unspecified as acute or chronic, without hemorrhage or perforation: Secondary | ICD-10-CM

## 2021-10-10 DIAGNOSIS — K2950 Unspecified chronic gastritis without bleeding: Secondary | ICD-10-CM

## 2021-10-10 DIAGNOSIS — K641 Second degree hemorrhoids: Secondary | ICD-10-CM | POA: Diagnosis not present

## 2021-10-10 DIAGNOSIS — K635 Polyp of colon: Secondary | ICD-10-CM | POA: Diagnosis not present

## 2021-10-10 DIAGNOSIS — D127 Benign neoplasm of rectosigmoid junction: Secondary | ICD-10-CM

## 2021-10-10 DIAGNOSIS — K529 Noninfective gastroenteritis and colitis, unspecified: Secondary | ICD-10-CM | POA: Diagnosis not present

## 2021-10-10 LAB — PROTEIN C, TOTAL: Protein C, Total: 135 % (ref 60–150)

## 2021-10-10 MED ORDER — SODIUM CHLORIDE 0.9 % IV SOLN: 500.0000 mL | INTRAVENOUS | Status: DC

## 2021-10-10 NOTE — Op Note (Signed)
[place] Patient Name: Mark Glenn Procedure Date: 10/10/2021 10:52 AM MRN: [id number] Endoscopist: Justice Britain , MD Age: 36 Referring MD:  Date of Birth: Apr 20, 1985 Gender: Male Account #: [id number] Procedure:                Upper GI endoscopy Indications:              Follow-up of gastric ulcer Medicines:                Monitored Anesthesia Care Procedure:                Pre-Anesthesia Assessment:                           - Prior to the procedure, a History and Physical                            was performed, and patient medications and                            allergies were reviewed. The patient's tolerance of                            previous anesthesia was also reviewed. The risks                            and benefits of the procedure and the sedation                            options and risks were discussed with the patient.                            All questions were answered, and informed consent                            was obtained. Prior Anticoagulants: The patient has                            taken no previous anticoagulant or antiplatelet                            agents. ASA Grade Assessment: II - A patient with                            mild systemic disease. After reviewing the risks                            and benefits, the patient was deemed in                            satisfactory condition to undergo the procedure.                           After obtaining informed consent, the endoscope was  passed under direct vision. Throughout the                            procedure, the patient's blood pressure, pulse, and                            oxygen saturations were monitored continuously. The                            GIF [id number] #[id number] was introduced through the                            mouth, and advanced to the second part of duodenum.                            The upper GI endoscopy was  accomplished without                            difficulty. The patient tolerated the procedure. Scope In: Scope Out: Findings:                 No gross lesions were noted in the proximal                            esophagus and in the mid esophagus.                           Scattered islands of salmon-colored mucosa were                            present from 39 to 40 cm. No other visible                            abnormalities were present. Biopsies were taken                            with a cold forceps for histology to rule out                            Barrett's.                           The Z-line was irregular and was found 40 cm from                            the incisors.                           A small healed ulcer scar was found in the gastric                            antrum.                           No other gross lesions were noted in  the entire                            examined stomach.                           No gross lesions were noted in the duodenal bulb,                            in the first portion of the duodenum and in the                            second portion of the duodenum. Complications:            No immediate complications. Estimated Blood Loss:     Estimated blood loss was minimal. Impression:               - No gross lesions in esophagus proximally.                            Salmon-colored mucosal islands suspicious for                            short-segment Barrett's esophagus - biopsied.                           - Z-line irregular, 40 cm from the incisors.                           - Scar in the gastric antrum from prior ulcer. No                            other gross lesions in the stomach.                           - No gross lesions in the duodenal bulb, in the                            first portion of the duodenum and in the second                            portion of the duodenum. Recommendation:           - Proceed to  scheduled colonoscopy.                           - Continue present medications.                           - Await pathology results.                           - Repeat upper endoscopy for surveillance based on                            pathology results if evidence of Barrett's is  found, otherwise follow up will not be required.                           - The findings and recommendations were discussed                            with the patient.                           - The findings and recommendations were discussed                            with the patient's family. Justice Britain, MD 10/10/2021 11:22:52 AM

## 2021-10-10 NOTE — Patient Instructions (Signed)
Discharge instructions given. Handouts on polyps and hemorrhoids. Resume previous medications. YOU HAD AN ENDOSCOPIC PROCEDURE TODAY AT [facility]:   Refer to the procedure report that was given to you for any specific questions about what was found during the examination.  If the procedure report does not answer your questions, please call your gastroenterologist to clarify.  If you requested that your care partner not be given the details of your procedure findings, then the procedure report has been included in a sealed envelope for you to review at your convenience later.  YOU SHOULD EXPECT: Some feelings of bloating in the abdomen. Passage of more gas than usual.  Walking can help get rid of the air that was put into your GI tract during the procedure and reduce the bloating. If you had a lower endoscopy (such as a colonoscopy or flexible sigmoidoscopy) you may notice spotting of blood in your stool or on the toilet paper. If you underwent a bowel prep for your procedure, you may not have a normal bowel movement for a few days.  Please Note:  You might notice some irritation and congestion in your nose or some drainage.  This is from the oxygen used during your procedure.  There is no need for concern and it should clear up in a day or so.  SYMPTOMS TO REPORT IMMEDIATELY:  Following lower endoscopy (colonoscopy or flexible sigmoidoscopy):  Excessive amounts of blood in the stool  Significant tenderness or worsening of abdominal pains  Swelling of the abdomen that is new, acute  Fever of 100F or higher  Following upper endoscopy (EGD)  Vomiting of blood or coffee ground material  New chest pain or pain under the shoulder blades  Painful or persistently difficult swallowing  New shortness of breath  Fever of 100F or higher  Black, tarry-looking stools  For urgent or emergent issues, a gastroenterologist can be reached at any hour by calling [phone]. Do not use  MyChart messaging for urgent concerns.    DIET:  We do recommend a small meal at first, but then you may proceed to your regular diet.  Drink plenty of fluids but you should avoid alcoholic beverages for 24 hours.  ACTIVITY:  You should plan to take it easy for the rest of today and you should NOT DRIVE or use heavy machinery until tomorrow (because of the sedation medicines used during the test).    FOLLOW UP: Our staff will call the number listed on your records the next business day following your procedure.  We will call around 7:15- 8:00 am to check on you and address any questions or concerns that you may have regarding the information given to you following your procedure. If we do not reach you, we will leave a message.  If you develop any symptoms (ie: fever, flu-like symptoms, shortness of breath, cough etc.) before then, please call [phone].  If you test positive for Covid 19 in the 2 weeks post procedure, please call and report this information to [place].    If any biopsies were taken you will be contacted by phone or by letter within the next 1-3 weeks.  Please call us at [phone] if you have not heard about the biopsies in 3 weeks.    SIGNATURES/CONFIDENTIALITY: You and/or your care partner have signed paperwork which will be entered into your electronic medical record.  These signatures attest to the fact that that the information above on your After Visit Summary has  been reviewed and is understood.  Full responsibility of the confidentiality of this discharge information lies with you and/or your care-partner.

## 2021-10-10 NOTE — Progress Notes (Signed)
Called to room to assist during endoscopic procedure.  Patient ID and intended procedure confirmed with present staff. Received instructions for my participation in the procedure from the performing physician.  

## 2021-10-10 NOTE — Op Note (Signed)
[place] Patient Name: Mark Glenn Procedure Date: 10/10/2021 10:52 AM MRN: [id number] Endoscopist: Justice Britain , MD Age: 36 Referring MD:  Date of Birth: Nov 08, 1985 Gender: Male Account #: [id number] Procedure:                Colonoscopy Indications:              Chronic ulcerative pancolitis, Follow-up of chronic                            ulcerative pancolitis, Disease activity assessment                            of chronic ulcerative pancolitis Medicines:                Monitored Anesthesia Care Procedure:                Pre-Anesthesia Assessment:                           - Prior to the procedure, a History and Physical                            was performed, and patient medications and                            allergies were reviewed. The patient's tolerance of                            previous anesthesia was also reviewed. The risks                            and benefits of the procedure and the sedation                            options and risks were discussed with the patient.                            All questions were answered, and informed consent                            was obtained. Prior Anticoagulants: The patient has                            taken no previous anticoagulant or antiplatelet                            agents. ASA Grade Assessment: II - A patient with                            mild systemic disease. After reviewing the risks                            and benefits, the patient was deemed in  satisfactory condition to undergo the procedure.                           After obtaining informed consent, the colonoscope                            was passed under direct vision. Throughout the                            procedure, the patient's blood pressure, pulse, and                            oxygen saturations were monitored continuously. The                            CF [id number] #[id number] was  introduced through the anus                            and advanced to the 6 cm into the ileum. The                            colonoscopy was performed without difficulty. The                            patient tolerated the procedure. The quality of the                            bowel preparation was good. The terminal ileum,                            ileocecal valve, appendiceal orifice, and rectum                            were photographed. Scope In: 11:01:58 AM Scope Out: 11:18:31 AM Scope Withdrawal Time: 0 hours 14 minutes 37 seconds  Total Procedure Duration: 0 hours 16 minutes 33 seconds  Findings:                 The digital rectal exam findings include                            hemorrhoids. Pertinent negatives include no                            palpable rectal lesions.                           The terminal ileum and ileocecal valve appeared                            normal. Biopsies were taken with a cold forceps for                            histology.  Two sessile polyps were found in the recto-sigmoid                            colon. The polyps were 2 to 4 mm in size. These                            polyps were removed with a cold snare. Resection                            and retrieval were complete.                           Normal mucosa was found in the entire colon                            otherwise. Biopsies were taken with a cold forceps                            for histology from the right colon. Biopsies were                            taken with a cold forceps for histology from the                            left colon . Biopsies were taken with a cold                            forceps for histology from the rectum.                           Non-bleeding non-thrombosed internal hemorrhoids                            were found during retroflexion, during perianal                            exam and during digital exam.  The hemorrhoids were                            Grade II (internal hemorrhoids that prolapse but                            reduce spontaneously). Complications:            No immediate complications. Estimated Blood Loss:     Estimated blood loss was minimal. Impression:               - Hemorrhoids found on digital rectal exam.                           - The examined portion of the ileum was normal.                            Biopsied.                           -  Two 2 to 4 mm polyps at the recto-sigmoid colon,                            removed with a cold snare. Resected and retrieved.                           - Normal mucosa in the entire examined colon                            otherwise. Biopsied as above for surveillance in                            setting of chronic colitis history.                           - Non-bleeding non-thrombosed internal hemorrhoids. Recommendation:           - The patient will be observed post-procedure,                            until all discharge criteria are met.                           - Discharge patient to home.                           - Patient has a contact number available for                            emergencies. The signs and symptoms of potential                            delayed complications were discussed with the                            patient. Return to normal activities tomorrow.                            Written discharge instructions were provided to the                            patient.                           - High fiber diet.                           - Continue present medications.                           - Await pathology results.                           - Repeat colonoscopy for surveillance based on  pathology results and if evidence of chronic                            colitis is still present or not.                           - The findings and recommendations were  discussed                            with the patient.                           - The findings and recommendations were discussed                            with the patient's family. Justice Britain, MD 10/10/2021 11:29:50 AM

## 2021-10-10 NOTE — Progress Notes (Signed)
To pacu, VSS. Report to [email or web address] 

## 2021-10-10 NOTE — Progress Notes (Signed)
GASTROENTEROLOGY PROCEDURE H&P NOTE   Primary Care Physician: [facility]  HPI: Mark Glenn is a 36 y.o. male who presents for EGD/Colonoscopy for follow up of previous GUs and for prior chronic colitis now off mesalamine for disease evaluation.  Past Medical History:  Diagnosis Date   DVT of lower extremity (deep venous thrombosis) (HCC)    GERD (gastroesophageal reflux disease)    Hypertension    Kidney calculus    Past Surgical History:  Procedure Laterality Date   ANKLE SURGERY Right    MOUTH SURGERY     due to root canal/crown/abscess   Current Outpatient Medications  Medication Sig Dispense Refill   amLODipine (NORVASC) 10 MG tablet Take 1 tablet (10 mg total) by mouth daily. 30 tablet 5   Garlic 6063 MG CAPS Take 1 capsule by mouth daily.     hydrALAZINE (APRESOLINE) 50 MG tablet Take 1 tablet (50 mg total) by mouth 2 (two) times daily. 60 tablet 5   lisinopril-hydrochlorothiazide (ZESTORETIC) 20-12.5 MG tablet Take 1 tablet by mouth daily. 30 tablet 5   Multiple Vitamin (ONE-A-DAY MENS PO) Take 1 tablet by mouth daily.     omeprazole (PRILOSEC) 40 MG capsule Take 1 capsule (40 mg total) by mouth daily. 30 capsule 11   acetaminophen (TYLENOL) 325 MG tablet Take 2 tablets (650 mg total) by mouth every 6 (six) hours as needed for mild pain (or Fever >/= 101). 30 tablet 3   ondansetron (ZOFRAN) 4 MG tablet Take 1 tablet (4 mg total) by mouth every 6 (six) hours. 12 tablet 0   Current Facility-Administered Medications  Medication Dose Route Frequency Provider Last Rate Last Admin   0.9 %  sodium chloride infusion  500 mL Intravenous Continuous Mansouraty, Telford Nab., MD        Current Outpatient Medications:    amLODipine (NORVASC) 10 MG tablet, Take 1 tablet (10 mg total) by mouth daily., Disp: 30 tablet, Rfl: 5   Garlic 0160 MG CAPS, Take 1 capsule by mouth daily., Disp: , Rfl:    hydrALAZINE (APRESOLINE) 50 MG tablet, Take 1 tablet (50 mg total)  by mouth 2 (two) times daily., Disp: 60 tablet, Rfl: 5   lisinopril-hydrochlorothiazide (ZESTORETIC) 20-12.5 MG tablet, Take 1 tablet by mouth daily., Disp: 30 tablet, Rfl: 5   Multiple Vitamin (ONE-A-DAY MENS PO), Take 1 tablet by mouth daily., Disp: , Rfl:    omeprazole (PRILOSEC) 40 MG capsule, Take 1 capsule (40 mg total) by mouth daily., Disp: 30 capsule, Rfl: 11   acetaminophen (TYLENOL) 325 MG tablet, Take 2 tablets (650 mg total) by mouth every 6 (six) hours as needed for mild pain (or Fever >/= 101)., Disp: 30 tablet, Rfl: 3   ondansetron (ZOFRAN) 4 MG tablet, Take 1 tablet (4 mg total) by mouth every 6 (six) hours., Disp: 12 tablet, Rfl: 0  Current Facility-Administered Medications:    0.9 %  sodium chloride infusion, 500 mL, Intravenous, Continuous, Mansouraty, Telford Nab., MD No Known Allergies Family History  Problem Relation Age of Onset   Clotting disorder Mother    Heart failure Father    Clotting disorder Brother        x 2   Clotting disorder Maternal Grandmother    Hypertension Paternal Grandfather    Colon cancer Neg Hx    Stomach cancer Neg Hx    Esophageal cancer Neg Hx    Pancreatic cancer Neg Hx    Inflammatory bowel disease Neg Hx  Liver disease Neg Hx    Rectal cancer Neg Hx    Social History   Socioeconomic History   Marital status: Married    Spouse name: Not on file   Number of children: 4   Years of education: Not on file   Highest education level: Not on file  Occupational History   Occupation: Sales  Tobacco Use   Smoking status: Former    Packs/day: 0.50    Types: Cigarettes    Quit date: 12/20/2020    Years since quitting: 0.8   Smokeless tobacco: Never  Vaping Use   Vaping Use: Former  Substance and Sexual Activity   Alcohol use: Not Currently   Drug use: Never   Sexual activity: Yes  Other Topics Concern   Not on file  Social History Narrative   Not on file   Social Determinants of Health   Financial Resource Strain: Not on  file  Food Insecurity: Not on file  Transportation Needs: Not on file  Physical Activity: Not on file  Stress: Not on file  Social Connections: Not on file  Intimate Partner Violence: Not on file    Physical Exam: Today's Vitals   10/10/21 1007 10/10/21 1010  BP: (!) 111/55   Pulse: 62   Temp: 99.1 F (37.3 C) 99.1 F (37.3 C)  TempSrc: Temporal   SpO2: 97%   Weight: 250 lb (113.4 kg)   Height: 5' 10"  (1.778 m)    Body mass index is 35.87 kg/m. GEN: NAD EYE: Sclerae anicteric ENT: MMM CV: Non-tachycardic GI: Soft, NT/ND NEURO:  Alert & Oriented x 3  Lab Results: No results for input(s): "WBC", "HGB", "HCT", "PLT" in the last 72 hours. BMET No results for input(s): "NA", "K", "CL", "CO2", "GLUCOSE", "BUN", "CREATININE", "CALCIUM" in the last 72 hours. LFT No results for input(s): "PROT", "ALBUMIN", "AST", "ALT", "ALKPHOS", "BILITOT", "BILIDIR", "IBILI" in the last 72 hours. PT/INR No results for input(s): "LABPROT", "INR" in the last 72 hours.   Impression / Plan: This is a 36 y.o.male who presents for EGD/Colonoscopy for follow up of previous GUs and for prior chronic colitis now off mesalamine for disease evaluation.  The risks and benefits of endoscopic evaluation/treatment were discussed with the patient and/or family; these include but are not limited to the risk of perforation, infection, bleeding, missed lesions, lack of diagnosis, severe illness requiring hospitalization, as well as anesthesia and sedation related illnesses.  The patient's history has been reviewed, patient examined, no change in status, and deemed stable for procedure.  The patient and/or family is agreeable to proceed.    Justice Britain, MD [facility]oscopy Office # [phone]

## 2021-10-11 ENCOUNTER — Telehealth: Payer: Self-pay | Admitting: *Deleted

## 2021-10-11 NOTE — Telephone Encounter (Signed)
No answer on  follow up call. Left message.

## 2021-10-13 LAB — PROTEIN C ACTIVITY: Protein C Activity: 195 % — ABNORMAL HIGH (ref 73–180)

## 2021-10-13 LAB — PROTEIN S, TOTAL: Protein S Ag, Total: 86 % (ref 60–150)

## 2021-10-13 LAB — PROTEIN S ACTIVITY: Protein S Activity: 104 % (ref 63–140)

## 2021-10-17 ENCOUNTER — Encounter: Payer: Self-pay | Admitting: Gastroenterology

## 2021-10-17 NOTE — Progress Notes (Unsigned)
[address] [place], [place] [address]   CLINIC:  Medical Oncology/Hematology  PCP:  [facility] [address] [place] [address] [phone]   REASON FOR VISIT:  Follow-up for left leg DVT   CURRENT THERAPY: Eliquis 5 mg twice daily   INTERVAL HISTORY:  Mr. Malinoski 36 y.o. male) returns for routine follow-up of his left leg DVT.  He was last seen by Tarri Abernethy PA-C on 07/26/2021.  Eliquis was discontinued at his last visit.  *** He does continue to have ongoing signs of GI bleeding (daily maroon-colored stools) due to his suspected IBD; he follows with gastroenterology.  *** No other signs of bleeding.*** *** Colonoscopy and EGD from 10/10/2021 showed internal and external hemorrhoids and polyps of the colon; possible Barrett's esophagus and gastric scar from prior ulcer.  *** He does complain of fatigue. *** He is taking his iron tablet once daily without any issues.   He continues to have intermittent left leg swelling/dependent edema if he stands for prolonged periods.  *** *** He has intermittent left leg and left foot pain.  (Left foot pain is thought to be secondary to degenerative changes and falling arch.) *** He has intermittent dyspnea on exertion. *** He denies any chest pain, cough, shortness of breath at rest, or hemoptysis.  He has *** energy and  *** % appetite. He endorses that he is maintaining a stable weight.   REVIEW OF SYSTEMS:  ***  Review of Systems  Constitutional:  Positive for fatigue. Negative for appetite change, chills, diaphoresis, fever and unexpected weight change.  HENT:   Negative for lump/mass and nosebleeds.   Eyes:  Negative for eye problems.  Respiratory:  Positive for shortness of breath (with exertion). Negative for cough and hemoptysis.   Cardiovascular:  Negative for chest pain, leg swelling and palpitations.  Gastrointestinal:  Positive for blood in stool (inflammatory bowel  disease) and diarrhea. Negative for abdominal pain, constipation, nausea and vomiting.  Genitourinary:  Negative for hematuria.   Skin: Negative.   Neurological:  Positive for headaches. Negative for dizziness and light-headedness.  Hematological:  Does not bruise/bleed easily.  Psychiatric/Behavioral:  Positive for sleep disturbance.       PAST MEDICAL/SURGICAL HISTORY:  Past Medical History:  Diagnosis Date   DVT of lower extremity (deep venous thrombosis) (HCC)    GERD (gastroesophageal reflux disease)    Hypertension    Kidney calculus    Past Surgical History:  Procedure Laterality Date   ANKLE SURGERY Right    MOUTH SURGERY     due to root canal/crown/abscess     SOCIAL HISTORY:  Social History   Socioeconomic History   Marital status: Married    Spouse name: Not on file   Number of children: 4   Years of education: Not on file   Highest education level: Not on file  Occupational History   Occupation: Sales  Tobacco Use   Smoking status: Former    Packs/day: 0.50    Types: Cigarettes    Quit date: 12/20/2020    Years since quitting: 0.8   Smokeless tobacco: Never  Vaping Use   Vaping Use: Former  Substance and Sexual Activity   Alcohol use: Not Currently   Drug use: Never   Sexual activity: Yes  Other Topics Concern   Not on file  Social History Narrative   Not on file   Social Determinants of Health   Financial Resource Strain: Not  on file  Food Insecurity: Not on file  Transportation Needs: Not on file  Physical Activity: Not on file  Stress: Not on file  Social Connections: Not on file  Intimate Partner Violence: Not on file    FAMILY HISTORY:  Family History  Problem Relation Age of Onset   Clotting disorder Mother    Heart failure Father    Clotting disorder Brother        x 2   Clotting disorder Maternal Grandmother    Hypertension Paternal Grandfather    Colon cancer Neg Hx    Stomach cancer Neg Hx    Esophageal cancer Neg Hx     Pancreatic cancer Neg Hx    Inflammatory bowel disease Neg Hx    Liver disease Neg Hx    Rectal cancer Neg Hx     CURRENT MEDICATIONS:  Outpatient Encounter Medications as of 10/18/2021  Medication Sig   acetaminophen (TYLENOL) 325 MG tablet Take 2 tablets (650 mg total) by mouth every 6 (six) hours as needed for mild pain (or Fever >/= 101).   amLODipine (NORVASC) 10 MG tablet Take 1 tablet (10 mg total) by mouth daily.   Garlic 6629 MG CAPS Take 1 capsule by mouth daily.   hydrALAZINE (APRESOLINE) 50 MG tablet Take 1 tablet (50 mg total) by mouth 2 (two) times daily.   lisinopril-hydrochlorothiazide (ZESTORETIC) 20-12.5 MG tablet Take 1 tablet by mouth daily.   Multiple Vitamin (ONE-A-DAY MENS PO) Take 1 tablet by mouth daily.   omeprazole (PRILOSEC) 40 MG capsule Take 1 capsule (40 mg total) by mouth daily.   ondansetron (ZOFRAN) 4 MG tablet Take 1 tablet (4 mg total) by mouth every 6 (six) hours.   No facility-administered encounter medications on file as of 10/18/2021.    ALLERGIES:  No Known Allergies   PHYSICAL EXAM: *** ECOG PERFORMANCE STATUS: 1 - Symptomatic but completely ambulatory  There were no vitals filed for this visit. There were no vitals filed for this visit. Physical Exam Constitutional:      Appearance: Normal appearance. He is obese.  HENT:     Head: Normocephalic and atraumatic.     Mouth/Throat:     Mouth: Mucous membranes are moist.  Eyes:     Extraocular Movements: Extraocular movements intact.     Pupils: Pupils are equal, round, and reactive to light.  Cardiovascular:     Rate and Rhythm: Normal rate and regular rhythm.     Pulses: Normal pulses.     Heart sounds: Normal heart sounds.  Pulmonary:     Effort: Pulmonary effort is normal.     Breath sounds: Normal breath sounds.  Abdominal:     General: Bowel sounds are normal.     Palpations: Abdomen is soft.     Tenderness: There is no abdominal tenderness.  Musculoskeletal:         General: No swelling.     Right lower leg: No edema.     Left lower leg: No edema.  Lymphadenopathy:     Cervical: No cervical adenopathy.  Skin:    General: Skin is warm and dry.  Neurological:     General: No focal deficit present.     Mental Status: He is alert and oriented to person, place, and time.  Psychiatric:        Mood and Affect: Mood normal.        Behavior: Behavior normal.      LABORATORY DATA:  I have reviewed the labs  as listed.  CBC    Component Value Date/Time   WBC 11.7 (H) 10/04/2021 1105   RBC 4.37 10/04/2021 1105   HGB 13.3 10/04/2021 1105   HCT 38.4 (L) 10/04/2021 1105   PLT 329 10/04/2021 1105   MCV 87.9 10/04/2021 1105   MCH 30.4 10/04/2021 1105   MCHC 34.6 10/04/2021 1105   RDW 13.7 10/04/2021 1105   LYMPHSABS 4.9 (H) 10/04/2021 1105   MONOABS 0.6 10/04/2021 1105   EOSABS 0.3 10/04/2021 1105   BASOSABS 0.1 10/04/2021 1105      Latest Ref Rng & Units 10/01/2021    3:33 AM 09/08/2021    2:17 PM 03/15/2021    3:19 PM  CMP  Glucose 70 - 99 mg/dL 127  92  85   BUN 6 - 20 mg/dL 12  16  14    Creatinine 0.61 - 1.24 mg/dL 1.00  0.86  0.96   Sodium 135 - 145 mmol/L 139  137  137   Potassium 3.5 - 5.1 mmol/L 3.4  3.6  4.1   Chloride 98 - 111 mmol/L 106  104  101   CO2 22 - 32 mmol/L 26  25  29    Calcium 8.9 - 10.3 mg/dL 9.1  9.2  9.6   Total Protein 6.5 - 8.1 g/dL 7.7   8.0   Total Bilirubin 0.3 - 1.2 mg/dL 0.5   0.7   Alkaline Phos 38 - 126 U/L 57   42   AST 15 - 41 U/L 29   21   ALT 0 - 44 U/L 48   32     DIAGNOSTIC IMAGING:  I have independently reviewed the relevant imaging and discussed with the patient.  ASSESSMENT & PLAN: 1.  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 The patient did test positive for COVID-19 on 02/07/2021.  He also sees GI for chronic colitis which is suspected to possibly be IBD. - Strong maternal family history of DVT and unspecified clotting disorder.  Reports that his sister had pulmonary embolism. - Coagulopathy work-up (03/20/2021): NORMAL (negative factor V Leiden, prothrombin gene mutation, beta glycoprotein antibodies, cardiolipin antibodies, and lupus anticoagulant) - He was on Eliquis from 02/20/2021 through 07/26/2021. - Additional coagulopathy labs (10/04/2021, obtained after stopping Eliquis): Functional antithrombin III decreased at 55%.  *** Normal protein C and protein S. - Pending confirmation of Antithrombin Deficiency, will consider indefinite anticoagulation due to the following additional factors:  DVT weakly provoked in the setting of COVID-19 infection. Due to personal history of IBD, explained to the patient has 2-3 x higher risk of developing blood clots than the general population. He has a strong family history of blood clots and unspecified coagulopathy. Discussed with patient's that since he has had single episode of DVT in the past, he is now at increased risk for recurrent blood clots in the future. - PLAN: Repeat Antithrombin III functional assay in 1 month followed by office visit.  2.  Iron deficiency without anemia - CBC and iron panel checked due to ongoing rectal bleeding (follows with GI - Dr. Rush [place]mark)  - Labs (03/20/2021) showed Hgb 13.8, but marginally low ferritin  32 with iron saturation 11% and TIBC 432 - Colonoscopy and EGD from 10/10/2021 showed internal and external hemorrhoids and polyps of the colon; possible Barrett's esophagus and gastric scar from prior ulcer. - Patient has been taking ferrous sulfate 325 mg daily  ***  - Most recent labs (10/04/2021): Hgb 13.3/MCV 87.9,  ferritin 22, iron saturation 16% - PLAN: Due to persistence of symptomatic iron deficiency despite iron supplementation, recommend IV Venofer 300 mg x 3.  We discussed potential side effects and risk of reaction, and patient is agreeable to proceed. - Continue ferrous sulfate 325 mg daily. - Repeat CBC with iron panel in 3 months.  ***  3.  Other history - PMH: Chronic colitis (possible IBD), PUD (gastritis and gastric ulcers), CHF, hypertension, nephrolithiasis, obesity  - SOCIAL: Lives at home with his wife and 4 children.  He works at [employer].  He is a former smoker, quit in November 2022.  He occasionally drinks alcohol in social settings.  He denies any current or previous use of illicit drugs. - FAMILY: Strong family history of heart disease (paternal), hypertension (paternal), and coagulopathy (maternal)   All questions were answered. The patient knows to call the clinic with any problems, questions or concerns.  Medical decision making: Moderate ***   Time spent on visit: I spent  ***  minutes counseling the patient face to face. The total time spent in the appointment was  ***  minutes and more than 50% was on counseling.   Harriett Rush, PA-C   ***

## 2021-10-18 ENCOUNTER — Inpatient Hospital Stay: Payer: Medicaid Other | Admitting: Physician Assistant

## 2021-10-18 ENCOUNTER — Encounter: Payer: Self-pay | Admitting: Physician Assistant

## 2021-10-18 VITALS — BP 109/68 | HR 63 | Temp 98.0°F | Resp 16 | Ht 70.0 in | Wt 251.1 lb

## 2021-10-18 DIAGNOSIS — D500 Iron deficiency anemia secondary to blood loss (chronic): Secondary | ICD-10-CM

## 2021-10-18 DIAGNOSIS — D6859 Other primary thrombophilia: Secondary | ICD-10-CM

## 2021-10-18 DIAGNOSIS — Z86718 Personal history of other venous thrombosis and embolism: Secondary | ICD-10-CM | POA: Diagnosis not present

## 2021-10-18 HISTORY — DX: Iron deficiency anemia secondary to blood loss (chronic): D50.0

## 2021-10-18 NOTE — Patient Instructions (Signed)
[facility] at [facility] Discharge Instructions  You were seen today by Tarri Abernethy PA-C for your history of left leg DVT (blood clot).  Your blood tests showed POSSIBLE inherited blood condition called "Antithrombin III deficiency," which could predispose you to blood clots.  We will recheck this test in 1 month.  I would like you to stay off of blood thinners for the time being so that this test will be accurate, but pay close attention to any symptoms of recurrent blood clots in your legs or lungs and seek immediate medical attention if that occurs.  We will schedule you for IV iron x3 doses.  Continue taking IRON tablet (FERROUS SULFATE 325 mg) once daily.  We will recheck your iron levels at your next visit.  **Seek immediate medical attention if you have any symptoms of recurrent blood clots in your legs or lungs.  (See attached handouts for more information on the symptoms).  FOLLOW UP VISIT: Office visit in 4 to 6 weeks (1 week after lab test)   ** Thank you for trusting me with your healthcare!  I strive to provide all of my patients with quality care at each visit.  If you receive a survey for this visit, I would be so grateful to you for taking the time to provide feedback.  Thank you in advance!  ~ Mallorie Norrod                   Dr. Derek Jack   &   Tarri Abernethy, PA-C   - - - - - - - - - - - - - - - - - -     Thank you for choosing [place] at [facility] to provide your oncology and hematology care.  To afford each patient quality time with our provider, please arrive at least 15 minutes before your scheduled appointment time.   If you have a lab appointment with the [facility] please come in thru the Main Entrance and check in at the main information desk.  You need to re-schedule your appointment should you arrive 10 or more minutes late.  We strive to give you quality time with our providers, and arriving  late affects you and other patients whose appointments are after yours.  Also, if you no show three or more times for appointments you may be dismissed from the clinic at the providers discretion.     Again, thank you for choosing [facility].  Our hope is that these requests will decrease the amount of time that you wait before being seen by our physicians.       _____________________________________________________________  Should you have questions after your visit to [facility], please contact our office at [phone] and follow the prompts.  Our office hours are 8:00 a.m. and 4:30 p.m. Monday - Friday.  Please note that voicemails left after 4:00 p.m. may not be returned until the following business day.  We are closed weekends and major holidays.  You do have access to a nurse 24-7, just call the main number to the clinic [phone] and do not press any options, hold on the line and a nurse will answer the phone.    For prescription refill requests, have your pharmacy contact our office and allow 72 hours.    Due to Covid, you will need to wear a mask upon entering the hospital. If you do not have a  mask, a mask will be given to you at the Main Entrance upon arrival. For doctor visits, patients may have 1 support person age 28 or older with them. For treatment visits, patients can not have anyone with them due to social distancing guidelines and our immunocompromised population.

## 2021-11-01 ENCOUNTER — Inpatient Hospital Stay: Payer: Medicaid Other | Attending: Physician Assistant

## 2021-11-01 VITALS — BP 113/70 | HR 58 | Temp 97.6°F | Resp 18

## 2021-11-01 DIAGNOSIS — Z86718 Personal history of other venous thrombosis and embolism: Secondary | ICD-10-CM | POA: Insufficient documentation

## 2021-11-01 DIAGNOSIS — E611 Iron deficiency: Secondary | ICD-10-CM | POA: Insufficient documentation

## 2021-11-01 DIAGNOSIS — D500 Iron deficiency anemia secondary to blood loss (chronic): Secondary | ICD-10-CM

## 2021-11-01 MED ORDER — SODIUM CHLORIDE 0.9 % IV SOLN
300.0000 mg | Freq: Once | INTRAVENOUS | Status: AC
  Administered 2021-11-01: 300 mg via INTRAVENOUS
  Filled 2021-11-01: qty 300

## 2021-11-01 MED ORDER — SODIUM CHLORIDE 0.9 % IV SOLN: Freq: Once | INTRAVENOUS | Status: AC

## 2021-11-01 MED ORDER — ACETAMINOPHEN 325 MG PO TABS
650.0000 mg | ORAL_TABLET | Freq: Once | ORAL | Status: AC
  Administered 2021-11-01: 650 mg via ORAL
  Filled 2021-11-01: qty 2

## 2021-11-01 MED ORDER — LORATADINE 10 MG PO TABS
10.0000 mg | ORAL_TABLET | Freq: Once | ORAL | Status: AC
  Administered 2021-11-01: 10 mg via ORAL
  Filled 2021-11-01: qty 1

## 2021-11-01 NOTE — Progress Notes (Signed)
Pt presents today for Venofer IV iron infusion per provider's order. Vital signs stable and pt voiced no new complaints at this time.  Peripheral IV started with good blood return pre and post infusion.  Venofer 300 mg  given today per MD orders. Tolerated infusion without adverse affects. Vital signs stable. No complaints at this time. Discharged from clinic ambulatory in stable condition. Alert and oriented x 3. F/U with [facility] as scheduled.

## 2021-11-01 NOTE — Patient Instructions (Signed)
[facility]  Discharge Instructions: Thank you for choosing [facility] to provide your oncology and hematology care.  If you have a lab appointment with the [facility], please come in thru the Main Entrance and check in at the main information desk.  Wear comfortable clothing and clothing appropriate for easy access to any Portacath or PICC line.   We strive to give you quality time with your provider. You may need to reschedule your appointment if you arrive late (15 or more minutes).  Arriving late affects you and other patients whose appointments are after yours.  Also, if you miss three or more appointments without notifying the office, you may be dismissed from the clinic at the provider's discretion.      For prescription refill requests, have your pharmacy contact our office and allow 72 hours for refills to be completed.    Today you received Venofer IV iron infusion.       BELOW ARE SYMPTOMS THAT SHOULD BE REPORTED IMMEDIATELY: *FEVER GREATER THAN 100.4 F (38 C) OR HIGHER *CHILLS OR SWEATING *NAUSEA AND VOMITING THAT IS NOT CONTROLLED WITH YOUR NAUSEA MEDICATION *UNUSUAL SHORTNESS OF BREATH *UNUSUAL BRUISING OR BLEEDING *URINARY PROBLEMS (pain or burning when urinating, or frequent urination) *BOWEL PROBLEMS (unusual diarrhea, constipation, pain near the anus) TENDERNESS IN MOUTH AND THROAT WITH OR WITHOUT PRESENCE OF ULCERS (sore throat, sores in mouth, or a toothache) UNUSUAL RASH, SWELLING OR PAIN  UNUSUAL VAGINAL DISCHARGE OR ITCHING   Items with * indicate a potential emergency and should be followed up as soon as possible or go to the Emergency Department if any problems should occur.  Please show the CHEMOTHERAPY ALERT CARD or IMMUNOTHERAPY ALERT CARD at check-in to the Emergency Department and triage nurse.  Should you have questions after your visit or need to cancel or reschedule your appointment, please contact [facility] [phone]  and follow the prompts.  Office hours are 8:00 a.m. to 4:30 p.m. Monday - Friday. Please note that voicemails left after 4:00 p.m. may not be returned until the following business day.  We are closed weekends and major holidays. You have access to a nurse at all times for urgent questions. Please call the main number to the clinic [phone] and follow the prompts.  For any non-urgent questions, you may also contact your provider using MyChart. We now offer e-Visits for anyone 38 and older to request care online for non-urgent symptoms. For details visit [email or web address].   Also download the MyChart app! Go to the app store, search "MyChart", open the app, select [facility], and log in with your MyChart username and password.  Masks are optional in the cancer centers. If you would like for your care team to wear a mask while they are taking care of you, please let them know. You may have one support person who is at least 34 years old accompany you for your appointments.

## 2021-11-08 ENCOUNTER — Inpatient Hospital Stay: Payer: Medicaid Other

## 2021-11-08 VITALS — BP 122/70 | HR 52 | Temp 97.6°F | Resp 18

## 2021-11-08 DIAGNOSIS — E611 Iron deficiency: Secondary | ICD-10-CM | POA: Diagnosis not present

## 2021-11-08 DIAGNOSIS — D500 Iron deficiency anemia secondary to blood loss (chronic): Secondary | ICD-10-CM

## 2021-11-08 MED ORDER — SODIUM CHLORIDE 0.9 % IV SOLN: Freq: Once | INTRAVENOUS | Status: AC

## 2021-11-08 MED ORDER — SODIUM CHLORIDE 0.9 % IV SOLN
300.0000 mg | Freq: Once | INTRAVENOUS | Status: AC
  Administered 2021-11-08: 300 mg via INTRAVENOUS
  Filled 2021-11-08: qty 300

## 2021-11-08 NOTE — Progress Notes (Signed)
Patient presents today for Venofer infusion per providers order.  Vital signs WNL.  Patient took premedications at home.  Stable during infusion without adverse affects.  Vital signs stable.  No complaints at this time.  Discharge from clinic ambulatory in stable condition.  Alert and oriented X 3.  Follow up with [facility] as scheduled.

## 2021-11-08 NOTE — Patient Instructions (Addendum)
[facility]   Discharge Instructions: Thank you for choosing [facility] to provide your oncology and hematology care.  If you have a lab appointment with the Cancer Center, please come in thru the Main Entrance and check in at the main information desk.  Wear comfortable clothing and clothing appropriate for easy access to any Portacath or PICC line.   We strive to give you quality time with your provider. You may need to reschedule your appointment if you arrive late (15 or more minutes).  Arriving late affects you and other patients whose appointments are after yours.  Also, if you miss three or more appointments without notifying the office, you may be dismissed from the clinic at the provider's discretion.      For prescription refill requests, have your pharmacy contact our office and allow 72 hours for refills to be completed.    Today you received the following chemotherapy and/or immunotherapy agents Venofer      To help prevent nausea and vomiting after your treatment, we encourage you to take your nausea medication as directed.  BELOW ARE SYMPTOMS THAT SHOULD BE REPORTED IMMEDIATELY: *FEVER GREATER THAN 100.4 F (38 C) OR HIGHER *CHILLS OR SWEATING *NAUSEA AND VOMITING THAT IS NOT CONTROLLED WITH YOUR NAUSEA MEDICATION *UNUSUAL SHORTNESS OF BREATH *UNUSUAL BRUISING OR BLEEDING *URINARY PROBLEMS (pain or burning when urinating, or frequent urination) *BOWEL PROBLEMS (unusual diarrhea, constipation, pain near the anus) TENDERNESS IN MOUTH AND THROAT WITH OR WITHOUT PRESENCE OF ULCERS (sore throat, sores in mouth, or a toothache) UNUSUAL RASH, SWELLING OR PAIN  UNUSUAL VAGINAL DISCHARGE OR ITCHING   Items with * indicate a potential emergency and should be followed up as soon as possible or go to the Emergency Department if any problems should occur.  Please show the CHEMOTHERAPY ALERT CARD or IMMUNOTHERAPY ALERT CARD at check-in to the Emergency  Department and triage nurse.  Should you have questions after your visit or need to cancel or reschedule your appointment, please contact [facility] [phone]  and follow the prompts.  Office hours are 8:00 a.m. to 4:30 p.m. Monday - Friday. Please note that voicemails left after 4:00 p.m. may not be returned until the following business day.  We are closed weekends and major holidays. You have access to a nurse at all times for urgent questions. Please call the main number to the clinic [phone] and follow the prompts.  For any non-urgent questions, you may also contact your provider using MyChart. We now offer e-Visits for anyone 18 and older to request care online for non-urgent symptoms. For details visit [email or web address].   Also download the MyChart app! Go to the app store, search "MyChart", open the app, select [facility], and log in with your MyChart username and password.  Masks are optional in the cancer centers. If you would like for your care team to wear a mask while they are taking care of you, please let them know. You may have one support person who is at least 34 years old accompany you for your appointments.  

## 2021-11-15 ENCOUNTER — Inpatient Hospital Stay: Payer: Medicaid Other

## 2021-11-15 VITALS — BP 119/69 | HR 52 | Temp 96.7°F | Resp 18

## 2021-11-15 DIAGNOSIS — K259 Gastric ulcer, unspecified as acute or chronic, without hemorrhage or perforation: Secondary | ICD-10-CM

## 2021-11-15 DIAGNOSIS — K219 Gastro-esophageal reflux disease without esophagitis: Secondary | ICD-10-CM

## 2021-11-15 DIAGNOSIS — D6859 Other primary thrombophilia: Secondary | ICD-10-CM

## 2021-11-15 DIAGNOSIS — E611 Iron deficiency: Secondary | ICD-10-CM | POA: Diagnosis not present

## 2021-11-15 DIAGNOSIS — K529 Noninfective gastroenteritis and colitis, unspecified: Secondary | ICD-10-CM

## 2021-11-15 DIAGNOSIS — D500 Iron deficiency anemia secondary to blood loss (chronic): Secondary | ICD-10-CM

## 2021-11-15 MED ORDER — SODIUM CHLORIDE 0.9 % IV SOLN
300.0000 mg | Freq: Once | INTRAVENOUS | Status: AC
  Administered 2021-11-15: 300 mg via INTRAVENOUS
  Filled 2021-11-15: qty 300

## 2021-11-15 MED ORDER — SODIUM CHLORIDE 0.9 % IV SOLN: Freq: Once | INTRAVENOUS | Status: AC

## 2021-11-15 NOTE — Progress Notes (Signed)
Pt presents today for Venofer IV iron infusion per provider's order. Vital signs stable and pt voiced no new complaints at this time.  Pt took pre-meds Tylenol and Claritin at home prior to arrival. Peripheral IV started with good blood return pre and post infusion.  Venofer 300 mg  given today per MD orders. Tolerated infusion without adverse affects. Vital signs stable. No complaints at this time. Discharged from clinic ambulatory in stable condition. Alert and oriented x 3. F/U with [facility] as scheduled.

## 2021-11-15 NOTE — Patient Instructions (Signed)
[facility]  Discharge Instructions: Thank you for choosing [facility] to provide your oncology and hematology care.  If you have a lab appointment with the [facility], please come in thru the Main Entrance and check in at the main information desk.  Wear comfortable clothing and clothing appropriate for easy access to any Portacath or PICC line.   We strive to give you quality time with your provider. You may need to reschedule your appointment if you arrive late (15 or more minutes).  Arriving late affects you and other patients whose appointments are after yours.  Also, if you miss three or more appointments without notifying the office, you may be dismissed from the clinic at the provider's discretion.      For prescription refill requests, have your pharmacy contact our office and allow 72 hours for refills to be completed.    Today you received Venofer IV iron.   BELOW ARE SYMPTOMS THAT SHOULD BE REPORTED IMMEDIATELY: *FEVER GREATER THAN 100.4 F (38 C) OR HIGHER *CHILLS OR SWEATING *NAUSEA AND VOMITING THAT IS NOT CONTROLLED WITH YOUR NAUSEA MEDICATION *UNUSUAL SHORTNESS OF BREATH *UNUSUAL BRUISING OR BLEEDING *URINARY PROBLEMS (pain or burning when urinating, or frequent urination) *BOWEL PROBLEMS (unusual diarrhea, constipation, pain near the anus) TENDERNESS IN MOUTH AND THROAT WITH OR WITHOUT PRESENCE OF ULCERS (sore throat, sores in mouth, or a toothache) UNUSUAL RASH, SWELLING OR PAIN  UNUSUAL VAGINAL DISCHARGE OR ITCHING   Items with * indicate a potential emergency and should be followed up as soon as possible or go to the Emergency Department if any problems should occur.  Please show the CHEMOTHERAPY ALERT CARD or IMMUNOTHERAPY ALERT CARD at check-in to the Emergency Department and triage nurse.  Should you have questions after your visit or need to cancel or reschedule your appointment, please contact [facility] [phone]  and follow the prompts.  Office hours are 8:00 a.m. to 4:30 p.m. Monday - Friday. Please note that voicemails left after 4:00 p.m. may not be returned until the following business day.  We are closed weekends and major holidays. You have access to a nurse at all times for urgent questions. Please call the main number to the clinic [phone] and follow the prompts.  For any non-urgent questions, you may also contact your provider using MyChart. We now offer e-Visits for anyone 106 and older to request care online for non-urgent symptoms. For details visit [email or web address].   Also download the MyChart app! Go to the app store, search "MyChart", open the app, select [facility], and log in with your MyChart username and password.  Masks are optional in the cancer centers. If you would like for your care team to wear a mask while they are taking care of you, please let them know. You may have one support person who is at least 34 years old accompany you for your appointments.

## 2021-11-16 LAB — ANTITHROMBIN PANEL
AT III AG PPP IMM-ACNC: 47 % — ABNORMAL LOW (ref 72–124)
Antithrombin Activity: 50 % — ABNORMAL LOW (ref 75–135)

## 2021-11-22 ENCOUNTER — Inpatient Hospital Stay: Payer: Medicaid Other | Attending: Physician Assistant | Admitting: Physician Assistant

## 2021-11-22 ENCOUNTER — Encounter: Payer: Self-pay | Admitting: Physician Assistant

## 2021-11-22 VITALS — BP 114/63 | HR 59 | Temp 97.9°F | Resp 16 | Wt 253.1 lb

## 2021-11-22 DIAGNOSIS — Z86718 Personal history of other venous thrombosis and embolism: Secondary | ICD-10-CM | POA: Diagnosis present

## 2021-11-22 DIAGNOSIS — K219 Gastro-esophageal reflux disease without esophagitis: Secondary | ICD-10-CM | POA: Insufficient documentation

## 2021-11-22 DIAGNOSIS — I110 Hypertensive heart disease with heart failure: Secondary | ICD-10-CM | POA: Diagnosis not present

## 2021-11-22 DIAGNOSIS — Z7901 Long term (current) use of anticoagulants: Secondary | ICD-10-CM | POA: Diagnosis not present

## 2021-11-22 DIAGNOSIS — Z79899 Other long term (current) drug therapy: Secondary | ICD-10-CM | POA: Insufficient documentation

## 2021-11-22 DIAGNOSIS — I509 Heart failure, unspecified: Secondary | ICD-10-CM | POA: Diagnosis not present

## 2021-11-22 DIAGNOSIS — D500 Iron deficiency anemia secondary to blood loss (chronic): Secondary | ICD-10-CM | POA: Diagnosis not present

## 2021-11-22 DIAGNOSIS — D6859 Other primary thrombophilia: Secondary | ICD-10-CM | POA: Diagnosis present

## 2021-11-22 DIAGNOSIS — E611 Iron deficiency: Secondary | ICD-10-CM | POA: Insufficient documentation

## 2021-11-22 MED ORDER — APIXABAN 5 MG PO TABS: 5.0000 mg | ORAL_TABLET | Freq: Two times a day (BID) | ORAL | 5 refills | Status: AC

## 2021-11-22 NOTE — Patient Instructions (Signed)
Mark Glenn at [facility] **   You were seen today by Tarri Abernethy PA-C for your history of blood clots.    ANTITHROMBIN DEFICIENCY: Your blood tests showed that you have low levels of Antithrombin.  This is an inherited condition known as "Antithrombin III deficiency." Antithrombin your body's natural blood thinner to help prevent blood clots. Due to a genetic mutation, your body does not make normal levels of Antithrombin. This places you at an increased risk of blood clots throughout your life. It is important that you stay on lifelong blood thinner (Eliquis, Xarelto, warfarin, or similar). You are still at risk for getting blood clots even though you will be on a blood thinner.  (See ATTACHED HANDOUTS for increased information about blood clots and what to look for.) You also be at increased risk of bleeding due to being on the blood thinner. Since this is an inherited condition, your biological children have a 50% chance of also having Antithrombin deficiency.  It is important that you notify their pediatricians.  Your children will need to be tested for Antithrombin deficiency at around age 57 and may need to see pediatric hematology specialists.  Recommend that your siblings also undergo Antithrombin testing. You will have increased risk of blood clots at the time of surgery.  Please let your surgeons know that you have Antithrombin deficiency.  Please let us know prior to any surgeries so that we can facilitate this. Some patients with Antithrombin III deficiency also have resistance to heparin, which is a blood thinner commonly given to treat blood clots in hospital settings.  If you are hospitalized, it is important that your medical team know that you have Antithrombin deficiency.  IRON DEFICIENCY ANEMIA Continue taking iron pill daily We will recheck blood and iron levels at follow-up visit in 3 months You will have  an increased risk of bleeding since you are being placed back on a blood thinner. Please seek medical attention if you notice any signs of major bleeding or have severe fatigue, shortness of breath, or chest pain.  FOLLOW-UP APPOINTMENT: Labs in 3 months.  Office visit 1 week after labs.  ** Thank you for trusting me with your healthcare!  I strive to provide all of my patients with quality care at each visit.  If you receive a survey for this visit, I would be so grateful to you for taking the time to provide feedback.  Thank you in advance!  ~ Mark Glenn                   Dr. Derek Jack   &   Tarri Abernethy, PA-C   - - - - - - - - - - - - - - - - - -    Thank you for choosing [facility] at [facility] to provide your oncology and hematology care.  To afford each patient quality time with our provider, please arrive at least 15 minutes before your scheduled appointment time.   If you have a lab appointment with the [facility] please come in thru the Main Entrance and check in at the main information desk.  You need to re-schedule your appointment should you arrive 10 or more minutes late.  We strive to give you quality time with our providers, and arriving late affects you and other patients whose appointments are after yours.  Also, if you no show three or more times  for appointments you may be dismissed from the clinic at the providers discretion.     Again, thank you for choosing [facility]  Our hope is that these requests will decrease the amount of time that you wait before being seen by our physicians.       _____________________________________________________________  Should you have questions after your visit to [facility], please contact our office at [phone] and follow the prompts.  Our office hours are 8:00 a.m. and 4:30 p.m. Monday - Friday.  Please note that voicemails left after 4:00 p.m. may not be returned  until the following business day.  We are closed weekends and major holidays.  You do have access to a nurse 24-7, just call the main number to the clinic [phone] and do not press any options, hold on the line and a nurse will answer the phone.    For prescription refill requests, have your pharmacy contact our office and allow 72 hours.

## 2021-11-22 NOTE — Progress Notes (Signed)
Mark Glenn, [place] [address]    CLINIC:  Medical Oncology/Hematology  PCP:  [facility] [address] [place] [address] [phone]   REASON FOR VISIT:  Follow-up for left leg DVT   CURRENT THERAPY: Eliquis 5 mg twice daily   INTERVAL HISTORY:  Mark Glenn 36 y.o. male) returns for routine follow-up of his left leg DVT.  He was last seen by Mark Abernethy PA-C on 10/18/2021.  Eliquis was discontinued in June 2023.  He reports that his maroon-colored stools resolved at about the same time he stopped taking his mesalamine and his Eliquis.  No recent bright red blood per rectum or melena.  Colonoscopy and EGD from 10/10/2021 showed internal and external hemorrhoids and polyps of the colon; possible Barrett's esophagus and gastric scar from prior ulcer.  He reports improved energy after his IV iron (Venofer 300 mg x 3 from 11/01/2021 through 11/15/2021).  He continues to take iron tablet once daily without any issues.  He will infrequently have mild left leg dependent edema and intermittent left foot pain (thought to be secondary to degenerative changes and falling arch).  His dyspnea on exertion has resolved.  No chest pain, cough, shortness of breath at rest, hemoptysis  He has 75% energy and 75% appetite. He endorses that he is maintaining a stable weight.   REVIEW OF SYSTEMS:    Review of Systems  Constitutional:  Negative for appetite change, chills, diaphoresis, fatigue, fever and unexpected weight change.  HENT:   Negative for lump/mass and nosebleeds.   Eyes:  Negative for eye problems.  Respiratory:  Negative for cough, hemoptysis and shortness of breath.   Cardiovascular:  Negative for chest pain, leg swelling and palpitations.  Gastrointestinal:  Negative for abdominal pain, blood in stool, constipation, diarrhea, nausea and vomiting.  Genitourinary:  Negative for hematuria.   Skin: Negative.   Neurological:   Negative for dizziness and light-headedness.  Hematological:  Does not bruise/bleed easily.      PAST MEDICAL/SURGICAL HISTORY:  Past Medical History:  Diagnosis Date   DVT of lower extremity (deep venous thrombosis) (HCC)    GERD (gastroesophageal reflux disease)    Hypertension    Iron deficiency anemia due to chronic blood loss 10/18/2021   Kidney calculus    Past Surgical History:  Procedure Laterality Date   ANKLE SURGERY Right    MOUTH SURGERY     due to root canal/crown/abscess     SOCIAL HISTORY:  Social History   Socioeconomic History   Marital status: Married    Spouse name: Not on file   Number of children: 4   Years of education: Not on file   Highest education level: Not on file  Occupational History   Occupation: Sales  Tobacco Use   Smoking status: Former    Packs/day: 0.50    Types: Cigarettes    Quit date: 12/20/2020    Years since quitting: 0.9   Smokeless tobacco: Never  Vaping Use   Vaping Use: Former  Substance and Sexual Activity   Alcohol use: Not Currently   Drug use: Never   Sexual activity: Yes  Other Topics Concern   Not on file  Social History Narrative   Not on file   Social Determinants of Health   Financial Resource Strain: Not on file  Food Insecurity: Not on file  Transportation Needs: Not on file  Physical Activity: Not on file  Stress: Not on file  Social Connections: Not on file  Intimate Partner Violence: Not on file    FAMILY HISTORY:  Family History  Problem Relation Age of Onset   Clotting disorder Mother    Heart failure Father    Clotting disorder Brother        x 2   Clotting disorder Maternal Grandmother    Hypertension Paternal Grandfather    Colon cancer Neg Hx    Stomach cancer Neg Hx    Esophageal cancer Neg Hx    Pancreatic cancer Neg Hx    Inflammatory bowel disease Neg Hx    Liver disease Neg Hx    Rectal cancer Neg Hx     CURRENT MEDICATIONS:  Outpatient Encounter Medications as of  11/22/2021  Medication Sig   acetaminophen (TYLENOL) 325 MG tablet Take 2 tablets (650 mg total) by mouth every 6 (six) hours as needed for mild pain (or Fever >/= 101).   amLODipine (NORVASC) 10 MG tablet Take 1 tablet (10 mg total) by mouth daily.   Garlic 0277 MG CAPS Take 1 capsule by mouth daily.   hydrALAZINE (APRESOLINE) 50 MG tablet Take 1 tablet (50 mg total) by mouth 2 (two) times daily.   lisinopril-hydrochlorothiazide (ZESTORETIC) 20-12.5 MG tablet Take 1 tablet by mouth daily.   Multiple Vitamin (ONE-A-DAY MENS PO) Take 1 tablet by mouth daily.   omeprazole (PRILOSEC) 40 MG capsule Take 1 capsule (40 mg total) by mouth daily.   ondansetron (ZOFRAN) 4 MG tablet Take 1 tablet (4 mg total) by mouth every 6 (six) hours.   No facility-administered encounter medications on file as of 11/22/2021.    ALLERGIES:  No Known Allergies   PHYSICAL EXAM:  ECOG PERFORMANCE STATUS: 1 - Symptomatic but completely ambulatory  Vitals:   11/22/21 0837  BP: 114/63  Pulse: (!) 59  Resp: 16  Temp: 97.9 F (36.6 C)  SpO2: 98%   Filed Weights   11/22/21 0837  Weight: 253 lb 1.4 oz (114.8 kg)   Physical Exam Constitutional:      Appearance: Normal appearance. He is obese.  HENT:     Head: Normocephalic and atraumatic.     Mouth/Throat:     Mouth: Mucous membranes are moist.  Eyes:     Extraocular Movements: Extraocular movements intact.     Pupils: Pupils are equal, round, and reactive to light.  Cardiovascular:     Rate and Rhythm: Normal rate and regular rhythm.     Pulses: Normal pulses.     Heart sounds: Normal heart sounds.  Pulmonary:     Effort: Pulmonary effort is normal.     Breath sounds: Normal breath sounds.  Abdominal:     General: Bowel sounds are normal.     Palpations: Abdomen is soft.     Tenderness: There is no abdominal tenderness.  Musculoskeletal:        General: No swelling.     Right lower leg: No edema.     Left lower leg: No edema.  Lymphadenopathy:      Cervical: No cervical adenopathy.  Skin:    General: Skin is warm and dry.  Neurological:     General: No focal deficit present.     Mental Status: He is alert and oriented to person, place, and time.  Psychiatric:        Mood and Affect: Mood normal.        Behavior: Behavior normal.      LABORATORY DATA:  I have reviewed the labs  as listed.  CBC    Component Value Date/Time   WBC 11.7 (H) 10/04/2021 1105   RBC 4.37 10/04/2021 1105   HGB 13.3 10/04/2021 1105   HCT 38.4 (L) 10/04/2021 1105   PLT 329 10/04/2021 1105   MCV 87.9 10/04/2021 1105   MCH 30.4 10/04/2021 1105   MCHC 34.6 10/04/2021 1105   RDW 13.7 10/04/2021 1105   LYMPHSABS 4.9 (H) 10/04/2021 1105   MONOABS 0.6 10/04/2021 1105   EOSABS 0.3 10/04/2021 1105   BASOSABS 0.1 10/04/2021 1105      Latest Ref Rng & Units 10/01/2021    3:33 AM 09/08/2021    2:17 PM 03/15/2021    3:19 PM  CMP  Glucose 70 - 99 mg/dL 127  92  85   BUN 6 - 20 mg/dL 12  16  14    Creatinine 0.61 - 1.24 mg/dL 1.00  0.86  0.96   Sodium 135 - 145 mmol/L 139  137  137   Potassium 3.5 - 5.1 mmol/L 3.4  3.6  4.1   Chloride 98 - 111 mmol/L 106  104  101   CO2 22 - 32 mmol/L 26  25  29    Calcium 8.9 - 10.3 mg/dL 9.1  9.2  9.6   Total Protein 6.5 - 8.1 g/dL 7.7   8.0   Total Bilirubin 0.3 - 1.2 mg/dL 0.5   0.7   Alkaline Phos 38 - 126 U/L 57   42   AST 15 - 41 U/L 29   21   ALT 0 - 44 U/L 48   32     DIAGNOSTIC IMAGING:  I have independently reviewed the relevant imaging and discussed with the patient.  ASSESSMENT & PLAN: 1.  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 - At also sees GI for chronic colitis which is suspected to possibly be IBD. - Family history follows autosomal dominant pattern (patient's mother and 2 of her 4 children have had  blood clots): Maternal grandmother and maternal uncles with multiple blood clots Mother: Unprovoked DVT in her 56s, told that she had a "rare blood clot disorder" Sister: Unprovoked PE in her 66s - Coagulopathy work-up (03/20/2021): NORMAL (negative factor V Leiden, prothrombin gene mutation, beta glycoprotein antibodies, cardiolipin antibodies, and lupus anticoagulant) - He was on Eliquis from 02/20/2021 through 07/26/2021. - Additional coagulopathy labs (10/04/2021, obtained after stopping Eliquis): Functional antithrombin III decreased at 55%. Normal protein C and protein S. - Antithrombin panel (11/15/2021) shows decreased Antithrombin Activity (50%) and decreased quantitative Antithrombin (AT III AG) at 47% - this is consistent with Antithrombin deficiency, type I - Extensive discussion with patient regarding his diagnosis of Antithrombin deficiency, including the following: Hereditary deficiency of Antithrombin (bodies natural anticoagulant) which causes increased risk of blood clots throughout his lifetime. No indication for genetic testing, as the diagnosis has already been confirmed and knowledge about specific mutation would not change the plan going forward. Inheritance pattern is autosomal dominant, therefore patient's children have a 50% chance of inheriting this condition.   Recommend discussing further with his children's pediatrician Patient's biologic children should be tested for AT deficiency at around the time of puberty, and that this would especially have ramifications for his daughter if she were to be pregnant or considering hormonal birth control in the future Patient's other first-degree relatives  are also recommended to undergo testing Patient would be at increased risk of clotting around the time of surgery, and may require Antithrombin replacement therapy.  Patient advised to contact our office prior to any surgical procedures. Discussed that patient may or may not have heparin  resistance related to his ET deficiency. Discussed with patient that he will need indefinite anticoagulation due to personal and family history of VTE in the setting of AT deficiency. Discussed that warfarin has more data to support its use, but has a higher burden of monitoring and lifestyle/diet changes. Discussed that DOACs are also used to treat AT deficiency, although they do not have as much data due to being newer drugs. Patient opts to use DOAC medication rather than warfarin. Despite being on anticoagulation, patient still has the potential to develop VTE.  Educated on signs/symptoms that would prompt immediate medical evaluation. - PLAN: Prescription sent for Eliquis 5 mg twice daily.  2.  Iron deficiency without anemia - History of maroon-colored stools in the setting of inflammatory bowel disease (follows with GI, Dr. Rush Landmark) - Labs (03/20/2021) showed Hgb 13.8, but marginally low ferritin 32 with iron saturation 11% and TIBC 432 - Colonoscopy and EGD from 10/10/2021 showed internal and external hemorrhoids and polyps of the colon; possible Barrett's esophagus and gastric scar from prior ulcer. - Patient received IV Venofer 300 mg x 3 in September 2023 due to symptomatic iron deficiency - Patient has been taking ferrous sulfate 325 mg daily    - Most recent labs (10/04/2021): Hgb 13.3/MCV 87.9, ferritin 22, iron saturation 16% - Discussed increased risk of GI blood loss in the setting of chronic anticoagulation. - PLAN: Continue ferrous sulfate 325 mg daily. - Repeat CBC with iron panel in 3 months   3.  Other history - PMH: Chronic colitis (possible IBD), PUD (gastritis and gastric ulcers), CHF, hypertension, nephrolithiasis, obesity  - SOCIAL: Lives at home with his wife and 4 children.  He works at [employer].  He is a former smoker, quit in November 2022.  He occasionally drinks alcohol in social settings.  He denies any current or previous use of illicit drugs. - FAMILY: Strong  family history of heart disease (paternal), hypertension (paternal), and coagulopathy (maternal)   All questions were answered. The patient knows to call the clinic with any problems, questions or concerns.  Medical decision making: Moderate    Time spent on visit: I spent 20 minutes counseling the patient face to face. The total time spent in the appointment was 30 minutes and more than 50% was on counseling.   Harriett Rush, PA-C  11/22/2021 9:33 PM

## 2022-01-17 ENCOUNTER — Inpatient Hospital Stay: Payer: Medicaid Other

## 2022-01-24 ENCOUNTER — Inpatient Hospital Stay: Payer: Medicaid Other | Admitting: Physician Assistant

## 2022-02-21 ENCOUNTER — Inpatient Hospital Stay: Payer: Medicaid Other | Attending: Physician Assistant

## 2022-02-21 DIAGNOSIS — E611 Iron deficiency: Secondary | ICD-10-CM | POA: Diagnosis present

## 2022-02-21 DIAGNOSIS — D6859 Other primary thrombophilia: Secondary | ICD-10-CM | POA: Diagnosis present

## 2022-02-21 DIAGNOSIS — D500 Iron deficiency anemia secondary to blood loss (chronic): Secondary | ICD-10-CM

## 2022-02-21 DIAGNOSIS — Z86718 Personal history of other venous thrombosis and embolism: Secondary | ICD-10-CM | POA: Insufficient documentation

## 2022-02-21 LAB — COMPREHENSIVE METABOLIC PANEL
ALT: 51 U/L — ABNORMAL HIGH (ref 0–44)
AST: 30 U/L (ref 15–41)
Albumin: 4.2 g/dL (ref 3.5–5.0)
Alkaline Phosphatase: 54 U/L (ref 38–126)
Anion gap: 9 (ref 5–15)
BUN: 14 mg/dL (ref 6–20)
CO2: 25 mmol/L (ref 22–32)
Calcium: 8.9 mg/dL (ref 8.9–10.3)
Chloride: 103 mmol/L (ref 98–111)
Creatinine, Ser: 0.92 mg/dL (ref 0.61–1.24)
GFR, Estimated: >60 mL/min (ref >60)
Glucose, Bld: 147 mg/dL — ABNORMAL HIGH (ref 70–99)
Potassium: 3.1 mmol/L — ABNORMAL LOW (ref 3.5–5.1)
Sodium: 137 mmol/L (ref 135–145)
Total Bilirubin: 0.8 mg/dL (ref 0.3–1.2)
Total Protein: 7.4 g/dL (ref 6.5–8.1)

## 2022-02-21 LAB — CBC WITH DIFFERENTIAL/PLATELET
Abs Immature Granulocytes: 0.03 10*3/uL (ref 0.00–0.07)
Basophils Absolute: 0.1 10*3/uL (ref 0.0–0.1)
Basophils Relative: 1 %
Eosinophils Absolute: 0.3 10*3/uL (ref 0.0–0.5)
Eosinophils Relative: 4 %
HCT: 40.4 % (ref 39.0–52.0)
Hemoglobin: 13.7 g/dL (ref 13.0–17.0)
Immature Granulocytes: 0 %
Lymphocytes Relative: 31 %
Lymphs Abs: 2.9 10*3/uL (ref 0.7–4.0)
MCH: 31 pg (ref 26.0–34.0)
MCHC: 33.9 g/dL (ref 30.0–36.0)
MCV: 91.4 fL (ref 80.0–100.0)
Monocytes Absolute: 0.5 10*3/uL (ref 0.1–1.0)
Monocytes Relative: 5 %
Neutro Abs: 5.6 10*3/uL (ref 1.7–7.7)
Neutrophils Relative %: 59 %
Platelets: 313 10*3/uL (ref 150–400)
RBC: 4.42 MIL/uL (ref 4.22–5.81)
RDW: 13.4 % (ref 11.5–15.5)
WBC: 9.4 10*3/uL (ref 4.0–10.5)
nRBC: 0 % (ref 0.0–0.2)

## 2022-02-21 LAB — IRON AND TIBC
Iron: 71 ug/dL (ref 45–182)
Saturation Ratios: 19 % (ref 17.9–39.5)
TIBC: 365 ug/dL (ref 250–450)
UIBC: 294 ug/dL

## 2022-02-21 LAB — FERRITIN: Ferritin: 150 ng/mL (ref 24–336)

## 2022-02-27 NOTE — Progress Notes (Deleted)
NO SHOW

## 2022-02-28 ENCOUNTER — Inpatient Hospital Stay: Payer: Medicaid Other | Admitting: Physician Assistant

## 2022-03-31 ENCOUNTER — Emergency Department (HOSPITAL_COMMUNITY): Payer: Medicaid Other

## 2022-03-31 ENCOUNTER — Emergency Department (HOSPITAL_COMMUNITY)
Admission: EM | Admit: 2022-03-31 | Discharge: 2022-03-31 | Disposition: A | Discharge status: Home / Self Care | Payer: Medicaid Other | Attending: Emergency Medicine | Admitting: Emergency Medicine

## 2022-03-31 DIAGNOSIS — Z7901 Long term (current) use of anticoagulants: Secondary | ICD-10-CM | POA: Diagnosis not present

## 2022-03-31 DIAGNOSIS — G8929 Other chronic pain: Secondary | ICD-10-CM | POA: Insufficient documentation

## 2022-03-31 DIAGNOSIS — M25511 Pain in right shoulder: Secondary | ICD-10-CM | POA: Diagnosis present

## 2022-03-31 DIAGNOSIS — Z79899 Other long term (current) drug therapy: Secondary | ICD-10-CM | POA: Diagnosis not present

## 2022-03-31 DIAGNOSIS — I10 Essential (primary) hypertension: Secondary | ICD-10-CM | POA: Diagnosis not present

## 2022-03-31 MED ORDER — NAPROXEN 250 MG PO TABS
500.0000 mg | ORAL_TABLET | Freq: Once | ORAL | Status: AC
  Administered 2022-03-31: 500 mg via ORAL
  Filled 2022-03-31: qty 2

## 2022-03-31 MED ORDER — MELOXICAM 7.5 MG PO TABS: 7.5000 mg | ORAL_TABLET | Freq: Two times a day (BID) | ORAL | 0 refills | Status: AC | PRN

## 2022-03-31 NOTE — Discharge Instructions (Signed)
Your x-rays are normal, no signs of broken bones or dislocations.  He will likely have a tear of a ligament or labrum in your shoulder.  Please wear the sling for comfort, take Mobic twice a day as needed for pain, use the sling as needed  See the orthopedic doctor of your choice or Dr. Aline Brochure who is here locally.  ER for worsening symptoms

## 2022-03-31 NOTE — ED Provider Notes (Signed)
[facility] Provider Note   CSN: [id number] Arrival date & time: 03/31/22  1627     History  Chief Complaint  Patient presents with   Shoulder Pain    right    Mark Glenn is a 37 y.o. male.   Shoulder Pain  This patient is a 37 old male, he has hypertension and a history of DVTs.  He presents with a complaint of right shoulder pain which has been present now for the last several months or even longer.  The patient reports to me that he has had multiple dislocations of his right shoulder, he cannot even count how many times it does it and sometimes it happens at home and he has to relocate it himself.  He does not have any numbness or weakness or tingling in his arm but has ongoing and worsening pain of his shoulder and now feels like he cannot reach his right arm behind his back.    Home Medications Prior to Admission medications   Medication Sig Start Date End Date Taking? Authorizing Provider  meloxicam (MOBIC) 7.5 MG tablet Take 1 tablet (7.5 mg total) by mouth 2 (two) times daily as needed for up to 14 days for pain. 03/31/22 04/14/22 Yes Noemi Chapel, MD  acetaminophen (TYLENOL) 325 MG tablet Take 2 tablets (650 mg total) by mouth every 6 (six) hours as needed for mild pain (or Fever >/= 101). 09/17/20   Roxan Hockey, MD  amLODipine (NORVASC) 10 MG tablet Take 1 tablet (10 mg total) by mouth daily. 09/18/20   Roxan Hockey, MD  apixaban (ELIQUIS) 5 MG TABS tablet Take 1 tablet (5 mg total) by mouth 2 (two) times daily. 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    [provider]  hydrALAZINE (APRESOLINE) 50 MG tablet Take 1 tablet (50 mg total) by mouth 2 (two) times daily. 09/17/20   Roxan Hockey, MD  lisinopril-hydrochlorothiazide (ZESTORETIC) 20-12.5 MG tablet Take 1 tablet by mouth daily. 09/17/20   Roxan Hockey, MD  Multiple  Vitamin (ONE-A-DAY MENS PO) Take 1 tablet by mouth daily.    [provider]  omeprazole (PRILOSEC) 40 MG capsule Take 1 capsule (40 mg total) by mouth daily. 09/08/21   Mansouraty, Telford Nab., MD  ondansetron (ZOFRAN) 4 MG tablet Take 1 tablet (4 mg total) by mouth every 6 (six) hours. 01/13/21   Evalee Jefferson, PA-C      Allergies    Patient has no known allergies.    Review of Systems   Review of Systems  All other systems reviewed and are negative.   Physical Exam Updated Vital Signs BP (!) 134/92 (BP Location: Left Arm)   Pulse 71   Temp 98.1 F (36.7 C) (Oral)   Resp 18   Ht 1.753 m (5' 9"$ )   Wt 113.4 kg   SpO2 98%   BMI 36.92 kg/m  Physical Exam Vitals and nursing note reviewed.  Constitutional:      Appearance: He is well-developed. He is not diaphoretic.  HENT:     Head: Normocephalic and atraumatic.  Eyes:     General:        Right eye: No discharge.        Left eye: No discharge.     Conjunctiva/sclera: Conjunctivae normal.  Pulmonary:     Effort: Pulmonary effort is normal. No respiratory distress.  Musculoskeletal:  General: Tenderness present. No swelling, deformity or signs of injury.     Right lower leg: No edema.     Left lower leg: No edema.     Comments: Normal pulses at the bilateral radial arteries.  Totally normal strength of the bilateral upper extremities.  There is slight decreased range of motion of the right shoulder secondary to discomfort that radiates towards the posterior shoulder.  He is able to laterally raise the arm as well as internally and externally rotate though some of this causes some mild to moderate pain.  Skin:    General: Skin is warm and dry.     Findings: No erythema or rash.  Neurological:     Mental Status: He is alert.     Coordination: Coordination normal.     Comments: Normal strength and sensation of the bilateral upper extremities     ED Results / Procedures / Treatments   Labs (all labs ordered  are listed, but only abnormal results are displayed) Labs Reviewed - No data to display  EKG None  Radiology DG Shoulder Right  Result Date: 03/31/2022 CLINICAL DATA:  Right shoulder pain for the past 2 weeks EXAM: RIGHT SHOULDER - 2+ VIEW COMPARISON:  None Available. FINDINGS: There is no evidence of fracture or dislocation. There is no evidence of arthropathy or other focal bone abnormality. Soft tissues are unremarkable. IMPRESSION: Negative. Electronically Signed   By: Jacqulynn Cadet M.D.   On: 03/31/2022 17:29    Procedures Procedures    Medications Ordered in ED Medications  naproxen (NAPROSYN) tablet 500 mg (500 mg Oral Given 03/31/22 1728)    ED Course/ Medical Decision Making/ A&P                             Medical Decision Making Amount and/or Complexity of Data Reviewed Radiology: ordered.  Risk Prescription drug management.   Overall the patient is well-appearing, needs an x-ray of the shoulder to rule out fractures, multiple recurrent dislocations could lead to some type of humeral head deformity or fracture or AVN.  Sling, orthopedic referral, may have rotator cuff or labral injury.  Imaging: I personally viewed and interpreted the x-rays which show no signs of broken bones fractures or dislocations.  Sling given for comfort  Anti-inflammatories given for discomfort  Home with same, local orthopedic referral        Final Clinical Impression(s) / ED Diagnoses Final diagnoses:  Chronic right shoulder pain    Rx / DC Orders ED Discharge Orders          Ordered    meloxicam (MOBIC) 7.5 MG tablet  2 times daily PRN        03/31/22 1747              Noemi Chapel, MD 03/31/22 [phone]

## 2022-03-31 NOTE — ED Triage Notes (Signed)
Pt to ED c/o right shoulder pain x 2 weeks, reports chronic problems with right shoulder. No known new injury, reports concerned about "damage to rotator cuff"

## 2022-04-02 ENCOUNTER — Telehealth: Payer: Self-pay | Admitting: Orthopaedic Surgery

## 2022-04-02 ENCOUNTER — Telehealth: Payer: Self-pay | Admitting: Orthopedic Surgery

## 2022-04-02 NOTE — Telephone Encounter (Signed)
Returned the patient's call, Mark Glenn

## 2022-04-02 NOTE — Telephone Encounter (Signed)
Returned the patient's call, pt was seen in the ED 2/10 for rt shoulder pain, no fracture.  LVM for the patient to call us to schedule.

## 2022-04-09 ENCOUNTER — Ambulatory Visit: Payer: Medicaid Other | Admitting: Orthopedic Surgery

## 2022-04-11 ENCOUNTER — Ambulatory Visit (INDEPENDENT_AMBULATORY_CARE_PROVIDER_SITE_OTHER): Payer: Medicaid Other

## 2022-04-11 ENCOUNTER — Ambulatory Visit: Payer: Medicaid Other | Admitting: Orthopedic Surgery

## 2022-04-11 ENCOUNTER — Encounter: Payer: Self-pay | Admitting: Orthopedic Surgery

## 2022-04-11 VITALS — BP 156/98 | HR 76 | Ht 69.0 in | Wt 268.0 lb

## 2022-04-11 DIAGNOSIS — G8929 Other chronic pain: Secondary | ICD-10-CM

## 2022-04-11 DIAGNOSIS — M25311 Other instability, right shoulder: Secondary | ICD-10-CM | POA: Diagnosis not present

## 2022-04-11 DIAGNOSIS — M25511 Pain in right shoulder: Secondary | ICD-10-CM | POA: Diagnosis not present

## 2022-04-11 NOTE — Patient Instructions (Signed)
Physical therapy for right shoulder instability  Follow up in 2 months; call if having problems before the next appointment

## 2022-04-11 NOTE — Progress Notes (Unsigned)
New Patient Visit  Assessment: Mark Glenn is a 37 y.o. male with the following: 1. Shoulder joint instability, right  Plan: Brisen Gaffey has right shoulder instability.  He has sustained multiple dislocations.  He is now at a point where he dislocates in his sleep.  He is able to locate his shoulder when this happens.  His last dislocation was approximately 3 weeks ago.  He has not worked with physical therapy.  Will have him initiate this to soon as possible.  If he does not do well, or his pain or symptoms worsen, he will return to clinic sooner.  Otherwise, I will see him in 2 months.  Depending on the efficacy of therapy at that visit, we will plan to proceed with an MRI with contrast.  Follow-up: Return in about 8 weeks (around 06/06/2022).  Subjective:  Chief Complaint  Patient presents with   Shoulder Pain    RT shoulder/ Chronic//history of several dislocations   New Patient (Initial Visit)    History of Present Illness: Mark Glenn is a 37 y.o. male who presents for evaluation of right shoulder pain.  He is right-hand dominant.  He reports that he has had multiple dislocations over the past 15 years.  His first dislocation was at 63.  No physical therapy at that time.  Since then, he has had at least 15 dislocations.  It is now at the point, where he dislocates in his sleep.  He is able to locate his shoulder.  He has not needed to have it reduced recently.  He is not taking medications on a consistent basis.  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   Review of Systems: No fevers or chills No numbness or tingling No chest pain No shortness of breath No bowel or bladder dysfunction No GI distress No headaches   Medical History:  Past Medical History:  Diagnosis Date   DVT of lower extremity (deep venous thrombosis) (HCC)    GERD (gastroesophageal reflux disease)    Hypertension    Iron deficiency anemia due to chronic blood loss 10/18/2021    Kidney calculus     Past Surgical History:  Procedure Laterality Date   ANKLE SURGERY Right    MOUTH SURGERY     due to root canal/crown/abscess    Family History  Problem Relation Age of Onset   Clotting disorder Mother    Heart failure Father    Clotting disorder Brother        x 2   Clotting disorder Maternal Grandmother    Hypertension Paternal Grandfather    Colon cancer Neg Hx    Stomach cancer Neg Hx    Esophageal cancer Neg Hx    Pancreatic cancer Neg Hx    Inflammatory bowel disease Neg Hx    Liver disease Neg Hx    Rectal cancer Neg Hx    Social History   Tobacco Use   Smoking status: Former    Packs/day: 0.50    Types: Cigarettes    Quit date: 12/20/2020    Years since quitting: 1.3   Smokeless tobacco: Never  Vaping Use   Vaping Use: Former  Substance Use Topics   Alcohol use: Not Currently   Drug use: Never    No Known Allergies  Current Meds  Medication Sig   acetaminophen (TYLENOL) 325 MG tablet Take 2 tablets (650 mg total) by mouth every 6 (six) hours as needed for mild pain (or Fever >/=  101).   amLODipine (NORVASC) 10 MG tablet Take 1 tablet (10 mg total) by mouth daily.   apixaban (ELIQUIS) 5 MG TABS tablet Take 1 tablet (5 mg total) by mouth 2 (two) times daily. For Antithrombin III Deficiency   Garlic [id number] MG CAPS Take 1 capsule by mouth daily.   hydrALAZINE (APRESOLINE) 50 MG tablet Take 1 tablet (50 mg total) by mouth 2 (two) times daily.   lisinopril-hydrochlorothiazide (ZESTORETIC) 20-12.5 MG tablet Take 1 tablet by mouth daily.   meloxicam (MOBIC) 7.5 MG tablet Take 1 tablet (7.5 mg total) by mouth 2 (two) times daily as needed for up to 14 days for pain.   Multiple Vitamin (ONE-A-DAY MENS PO) Take 1 tablet by mouth daily.   omeprazole (PRILOSEC) 40 MG capsule Take 1 capsule (40 mg total) by mouth daily.   ondansetron (ZOFRAN) 4 MG tablet Take 1 tablet (4 mg total) by mouth every 6 (six) hours.    Objective: BP (!) 156/98    Pulse 76   Ht 5' 9"$  (1.753 m)   Wt 268 lb (121.6 kg)   BMI 39.58 kg/m   Physical Exam:  General: Alert and oriented. and No acute distress. Gait: Normal gait.  Right shoulder without obvious deformity.  Some atrophy of the shoulder girdle compared to the left.  Weakness demonstrated in the rotator cuff, included the supraspinatus and infraspinatus.  Positive apprehension, positive relocation testing.  He demonstrates some stiffness with external rotation in the 90/90 position.  IMAGING: I personally ordered and reviewed the following images  X-rays of the right shoulder are without acute injury.  Shoulder joint is reduced.  There is no obvious fracture on the glenoid.  Glenohumeral joint space is maintained.  No evidence of proximal humeral migration.  Impression: Negative right shoulder x-ray   New Medications:  No orders of the defined types were placed in this encounter.     Mordecai Rasmussen, MD  04/12/2022 8:55 PM

## 2022-04-19 ENCOUNTER — Encounter: Payer: Self-pay | Admitting: Radiology

## 2022-04-20 ENCOUNTER — Ambulatory Visit (HOSPITAL_COMMUNITY): Payer: Medicaid Other | Admitting: Occupational Therapy

## 2022-04-20 ENCOUNTER — Encounter (HOSPITAL_COMMUNITY): Payer: Medicaid Other | Admitting: Occupational Therapy

## 2022-05-15 ENCOUNTER — Encounter (HOSPITAL_COMMUNITY): Payer: Self-pay | Admitting: Occupational Therapy

## 2022-05-15 ENCOUNTER — Ambulatory Visit (HOSPITAL_COMMUNITY): Payer: Medicaid Other | Attending: Orthopedic Surgery | Admitting: Occupational Therapy

## 2022-05-15 DIAGNOSIS — M25611 Stiffness of right shoulder, not elsewhere classified: Secondary | ICD-10-CM | POA: Insufficient documentation

## 2022-05-15 DIAGNOSIS — M25311 Other instability, right shoulder: Secondary | ICD-10-CM | POA: Insufficient documentation

## 2022-05-15 DIAGNOSIS — M25511 Pain in right shoulder: Secondary | ICD-10-CM | POA: Diagnosis not present

## 2022-05-15 DIAGNOSIS — R29898 Other symptoms and signs involving the musculoskeletal system: Secondary | ICD-10-CM | POA: Insufficient documentation

## 2022-05-15 NOTE — Therapy (Unsigned)
OUTPATIENT OCCUPATIONAL THERAPY ORTHO EVALUATION  Patient Name: Mark Glenn MRN: [id number] DOB:06-16-1985, 37 y.o., male Today's Date: 05/16/2022  PCP: Yetta Glassman, MD [facility]) REFERRING PROVIDER: Larena Glassman, MD  END OF SESSION:  OT End of Session - 05/16/22 1925     Visit Number 1    Number of Visits 9    Date for OT Re-Evaluation 06/15/22    Authorization Type [employer]    OT Start Time 1600    OT Stop Time 1640    OT Time Calculation (min) 40 min    Activity Tolerance Patient tolerated treatment well    Behavior During Therapy [facility] for tasks assessed/performed             Past Medical History:  Diagnosis Date   DVT of lower extremity (deep venous thrombosis) (HCC)    GERD (gastroesophageal reflux disease)    Hypertension    Iron deficiency anemia due to chronic blood loss 10/18/2021   Kidney calculus    Past Surgical History:  Procedure Laterality Date   ANKLE SURGERY Right    MOUTH SURGERY     due to root canal/crown/abscess   Patient Active Problem List   Diagnosis Date Noted   Iron deficiency anemia due to chronic blood loss 10/18/2021   Gastroesophageal reflux disease 09/08/2021   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   On continuous oral anticoagulation 03/16/2021   Abdominal pain 03/16/2021   Multiple gastric ulcers 03/16/2021   Chronic colitis 03/16/2021   Abdominal pain, epigastric 01/17/2021   Nausea and vomiting 01/17/2021   Diarrhea 01/17/2021   Rectal bleeding 01/17/2021   Chronic heart failure with preserved ejection fraction (HFpEF) /chronic diastolic dysfunction CHF 0000000   HTN (hypertension) 09/17/2020   Obesity (BMI 30.0-34.9) 09/17/2020   Hypertensive urgency 09/16/2020   Nicotine dependence 09/16/2020   Hypokalemia 09/16/2020   Chest pain 09/16/2020    ONSET DATE: December 2023  REFERRING DIAG: R shoulder Instability  THERAPY DIAG:  Right shoulder pain, unspecified chronicity  Shoulder stiffness, right  Other  symptoms and signs involving the musculoskeletal system  Rationale for Evaluation and Treatment: Rehabilitation  SUBJECTIVE:   SUBJECTIVE STATEMENT: "I can't even hold my 37 year old" Pt accompanied by: self  PERTINENT HISTORY: Pt reports multiple traumatic dislocations from prior jobs. Now he reports that his arm will dislocate during his sleep.   PRECAUTIONS: None  WEIGHT BEARING RESTRICTIONS: No  PAIN:  Are you having pain? Yes: NPRS scale: 3/10 Pain location: shoulder girdle job Pain description: constant aching and throbbing Aggravating factors: movement Relieving factors: extra strength tylenol  FALLS: Has patient fallen in last 6 months? No  LIVING ENVIRONMENT: Lives with: lives with their family Lives in: Mobile home  PLOF: Independent  PATIENT GOALS: To get full mobility without pain in the right arm  NEXT MD VISIT: none  OBJECTIVE:   HAND DOMINANCE: Right  ADLs: Overall ADLs: Pt reports difficulty with reaching behind back and out to the side, which limits the ability to dress without assist and compensatory strategies. He has difficulty with holding his child, cooking, cleaning due to arm giving out and limited motion.   FUNCTIONAL OUTCOME MEASURES: Quick Dash: 56.82  UPPER EXTREMITY ROM:     Active ROM Right eval  Shoulder flexion 120  Shoulder abduction 110  Shoulder internal rotation 90  Shoulder external rotation 30  (Blank rows = not tested)   UPPER EXTREMITY MMT:     MMT Right eval  Shoulder flexion 4/5  Shoulder abduction 4/5  Shoulder adduction 4+/5  Shoulder extension 4+/5  Shoulder internal rotation 4-/5  Shoulder external rotation 4+/5  (Blank rows = not tested)  HAND FUNCTION: Grip strength: Right: 106 lbs; Left: 112 lbs  SENSATION: WFL  EDEMA: No swelling noted  OBSERVATIONS: min-mod fascial restrictions along biceps, trapezius, and pectoralis   TODAY'S TREATMENT:                                                                                                                               DATE: 05/15/22: Evaluation Only    PATIENT EDUCATION: Education details: Will Start HEP on 1st treatment session Person educated: Patient Education method: Explanation Education comprehension: verbalized understanding  HOME EXERCISE PROGRAM: Will Start on 1st treatment session  GOALS: Goals reviewed with patient? Yes  SHORT TERM GOALS: Target date: 06/15/22  Pt will be provided and educated on HEP to improve RUE mobility for ADL completion.   Goal status: INITIAL  2.  Pt will decrease pain in RUE to 3/10 in order to sleep 3+ consecutive hours without waking due to pain.   Goal status: INITIAL  3.  Pt will decrease fascial restrictions in RUE to minimal amounts or less in order to complete overhead reaching tasks.  Goal status: INITIAL  4.  Pt will increase A/ROM to [facility] in order to reach overhead and behind back during dressing and bathing tasks.   Goal status: INITIAL  5.  Pt will increase strength in RUE to 5/5 in order to complete lifting tasks during child care and IADL's.  Goal status: INITIAL  6.  Pt will improve RUE stability required for heavy duty tasks such as pushing, pulling, and lifting items weighing over 10#.   Goal status: INITIAL   ASSESSMENT:  CLINICAL IMPRESSION: Patient is a 37 y.o. male who was seen today for occupational therapy evaluation for R shoulder pain and instability. Pt reports difficulties with all IADL's, especially with taking care of his children. His ROM is limited and he is unable to sustain mobility against resistance or weight.    PERFORMANCE DEFICITS: in functional skills including ADLs, IADLs, ROM, strength, pain, fascial restrictions, Gross motor control, body mechanics, and UE functional use.  IMPAIRMENTS: are limiting patient from ADLs, IADLs, rest and sleep, work, leisure, and social participation.   COMORBIDITIES: has no other co-morbidities that  affects occupational performance. Patient will benefit from skilled OT to address above impairments and improve overall function.  MODIFICATION OR ASSISTANCE TO COMPLETE EVALUATION: No modification of tasks or assist necessary to complete an evaluation.  OT OCCUPATIONAL PROFILE AND HISTORY: Problem focused assessment: Including review of records relating to presenting problem.  CLINICAL DECISION MAKING: LOW - limited treatment options, no task modification necessary  REHAB POTENTIAL: Excellent  EVALUATION COMPLEXITY: Low      PLAN:  OT FREQUENCY: 2x/week  OT DURATION: 4 weeks  PLANNED INTERVENTIONS: self care/ADL training, therapeutic exercise, therapeutic activity, manual therapy, passive range of motion, functional mobility  training, electrical stimulation, ultrasound, patient/family education, coping strategies training, and DME and/or AE instructions  RECOMMENDED OTHER SERVICES: N/A  CONSULTED AND AGREED WITH PLAN OF CARE: Patient  PLAN FOR NEXT SESSION: Manual Therapy, P/ROM, AA/ROM, Isometrics   Verdis Bassette Yolanda Bonine, OTR/L [facility] [place], [place] 05/16/2022, 7:27 PM

## 2022-05-17 ENCOUNTER — Encounter (HOSPITAL_COMMUNITY): Payer: Self-pay

## 2022-05-17 ENCOUNTER — Emergency Department (HOSPITAL_COMMUNITY)
Admission: EM | Admit: 2022-05-17 | Discharge: 2022-05-17 | Disposition: A | Discharge status: Home / Self Care | Payer: Medicaid Other | Attending: Emergency Medicine | Admitting: Emergency Medicine

## 2022-05-17 ENCOUNTER — Emergency Department (HOSPITAL_COMMUNITY): Payer: Medicaid Other

## 2022-05-17 ENCOUNTER — Other Ambulatory Visit: Payer: Self-pay

## 2022-05-17 DIAGNOSIS — S4991XA Unspecified injury of right shoulder and upper arm, initial encounter: Secondary | ICD-10-CM | POA: Insufficient documentation

## 2022-05-17 DIAGNOSIS — W010XXA Fall on same level from slipping, tripping and stumbling without subsequent striking against object, initial encounter: Secondary | ICD-10-CM | POA: Insufficient documentation

## 2022-05-17 MED ORDER — LIDOCAINE 5 % EX PTCH
1.0000 | MEDICATED_PATCH | Freq: Once | CUTANEOUS | Status: DC
  Administered 2022-05-17: 1 via TRANSDERMAL
  Filled 2022-05-17: qty 1

## 2022-05-17 MED ORDER — DICLOFENAC SODIUM 1 % EX GEL: 2.0000 g | Freq: Four times a day (QID) | CUTANEOUS | 0 refills | Status: DC

## 2022-05-17 NOTE — ED Notes (Signed)
Patient transported to X-ray 

## 2022-05-17 NOTE — Discharge Instructions (Signed)
Contact a health care provider if: Your pain is not relieved with medicines. New pain develops in your arm, hand, or fingers. You loosen your sling and your arm, hand, or fingers remain tingly, numb, swollen, or painful. Get help right away if: Your arm, hand, or fingers turn white or blue. 

## 2022-05-17 NOTE — ED Provider Notes (Signed)
[facility] Provider Note   CSN: [id number] Arrival date & time: 05/17/22  1739     History  Chief Complaint  Patient presents with   Shoulder Injury    Mark Glenn is a 37 y.o. male who presents emergency department with chief complaint of right shoulder injury.  He has a history of recurrent dislocations is currently under physical therapy for joint laxity.  He slipped and fell under his table yesterday.  Since that time he has had increased anterior shoulder pain.  He has some chronic numbness in his ulnar nerve distribution in the fourth and fifth digits.  He denies any other changes or weakness in the arm.   Shoulder Injury       Home Medications Prior to Admission medications   Medication Sig Start Date End Date Taking? Authorizing Provider  diclofenac Sodium (VOLTAREN ARTHRITIS PAIN) 1 % GEL Apply 2 g topically 4 (four) times daily. 05/17/22  Yes Margarita Mail, PA-C  acetaminophen (TYLENOL) 325 MG tablet Take 2 tablets (650 mg total) by mouth every 6 (six) hours as needed for mild pain (or Fever >/= 101). 09/17/20   Roxan Hockey, MD  amLODipine (NORVASC) 10 MG tablet Take 1 tablet (10 mg total) by mouth daily. 09/18/20   Roxan Hockey, MD  apixaban (ELIQUIS) 5 MG TABS tablet Take 1 tablet (5 mg total) by mouth 2 (two) times daily. 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    [provider]  hydrALAZINE (APRESOLINE) 50 MG tablet Take 1 tablet (50 mg total) by mouth 2 (two) times daily. 09/17/20   Roxan Hockey, MD  lisinopril-hydrochlorothiazide (ZESTORETIC) 20-12.5 MG tablet Take 1 tablet by mouth daily. 09/17/20   Roxan Hockey, MD  Multiple Vitamin (ONE-A-DAY MENS PO) Take 1 tablet by mouth daily.    [provider]  omeprazole (PRILOSEC) 40 MG capsule Take 1 capsule (40 mg total) by mouth daily. 09/08/21   Mansouraty,  Telford Nab., MD  ondansetron (ZOFRAN) 4 MG tablet Take 1 tablet (4 mg total) by mouth every 6 (six) hours. 01/13/21   Evalee Jefferson, PA-C      Allergies    Patient has no known allergies.    Review of Systems   Review of Systems  Physical Exam Updated Vital Signs BP 122/76 (BP Location: Left Arm)   Pulse 81   Temp 98.7 F (37.1 C) (Oral)   Resp 18   Ht 5\' 9"  (1.753 m)   Wt 114.8 kg   SpO2 97%   BMI 37.36 kg/m  Physical Exam Vitals and nursing note reviewed.  Constitutional:      General: He is not in acute distress.    Appearance: He is well-developed. He is not diaphoretic.  HENT:     Head: Normocephalic and atraumatic.  Eyes:     General: No scleral icterus.    Conjunctiva/sclera: Conjunctivae normal.  Cardiovascular:     Rate and Rhythm: Normal rate and regular rhythm.     Heart sounds: Normal heart sounds.  Pulmonary:     Effort: Pulmonary effort is normal. No respiratory distress.     Breath sounds: Normal breath sounds.  Abdominal:     Palpations: Abdomen is soft.     Tenderness: There is no abdominal tenderness.  Musculoskeletal:     Cervical back: Normal range of motion and neck supple.     Comments: Patient  examines in sling.  No tenderness of his right elbow wrist, normal grip strength.  Slight bruising to the anterior area of the shoulder and it is tender to palpation.  Skin:    General: Skin is warm and dry.  Neurological:     Mental Status: He is alert.  Psychiatric:        Behavior: Behavior normal.     ED Results / Procedures / Treatments   Labs (all labs ordered are listed, but only abnormal results are displayed) Labs Reviewed - No data to display  EKG None  Radiology DG Shoulder Right  Result Date: 05/17/2022 CLINICAL DATA:  Injury EXAM: RIGHT SHOULDER - 2+ VIEW COMPARISON:  Right shoulder x-ray 04/11/2022 FINDINGS: There is no evidence of fracture or dislocation. There is no evidence of arthropathy or other focal bone abnormality. Soft  tissues are unremarkable. IMPRESSION: Negative. Electronically Signed   By: Ronney Asters M.D.   On: 05/17/2022 18:38    Procedures Procedures    Medications Ordered in ED Medications  lidocaine (LIDODERM) 5 % 1 patch (1 patch Transdermal Patch Applied 05/17/22 1849)    ED Course/ Medical Decision Making/ A&P                             Medical Decision Making I visualized and independently interpreted right shoulder x-ray, no acute findings.  Will treat with Lidoderm patch, anti-inflammatories and ice.  Patient may otherwise follow-up with orthopedics with whom he is already established.  Amount and/or Complexity of Data Reviewed Radiology: ordered.  Risk Prescription drug management.           Final Clinical Impression(s) / ED Diagnoses Final diagnoses:  Injury of right shoulder, initial encounter    Rx / DC Orders ED Discharge Orders          Ordered    diclofenac Sodium (VOLTAREN ARTHRITIS PAIN) 1 % GEL  4 times daily        05/17/22 1849              Margarita Mail, PA-C 05/17/22 1912    Milton Ferguson, MD 05/19/22 1233

## 2022-05-17 NOTE — ED Triage Notes (Signed)
Pt presents with R shoulder injury after falling against a table. Pt has ha hx of frequent dislocations in that shoulder and is currently undergoing physical therapy.

## 2022-05-18 ENCOUNTER — Telehealth (HOSPITAL_COMMUNITY): Payer: Self-pay | Admitting: Occupational Therapy

## 2022-05-18 ENCOUNTER — Ambulatory Visit (HOSPITAL_COMMUNITY): Payer: Medicaid Other | Admitting: Occupational Therapy

## 2022-05-18 NOTE — Telephone Encounter (Signed)
Attempted to call primary phone number for Garvie, but unable to complete call. Called his spouses listed phone number, no answer LVM. LVM discussing his appointment today- per chart review pt was in ED yesterday for shoulder pain so wanted to follow up to confirm he was coming to today's appointment or if we needed to cancel due to pain.   Pt no showed for his appt on 3/29  Arvil Persons, OTR/L

## 2022-05-23 ENCOUNTER — Encounter (HOSPITAL_COMMUNITY): Payer: Medicaid Other | Admitting: Occupational Therapy

## 2022-05-28 ENCOUNTER — Encounter (HOSPITAL_COMMUNITY): Payer: Medicaid Other | Admitting: Occupational Therapy

## 2022-05-28 ENCOUNTER — Telehealth (HOSPITAL_COMMUNITY): Payer: Self-pay | Admitting: Occupational Therapy

## 2022-05-28 NOTE — Telephone Encounter (Signed)
Attempted to call pt x2 on this day (05/28/22) regarding his 3 no shows. Pt will be discharged from OT following Outpatient clinic's policy of 3 no shows. Pt has not returned since his initial evaluation. Pt will need a new referral if he wishes to return to OT.   Trish Mage, OTR/L [facility] [phone]

## 2022-05-30 ENCOUNTER — Encounter (HOSPITAL_COMMUNITY): Payer: Medicaid Other | Admitting: Occupational Therapy

## 2022-06-04 ENCOUNTER — Encounter (HOSPITAL_COMMUNITY): Payer: Medicaid Other | Admitting: Occupational Therapy

## 2022-06-06 ENCOUNTER — Encounter (HOSPITAL_COMMUNITY): Payer: Medicaid Other | Admitting: Occupational Therapy

## 2022-06-12 ENCOUNTER — Encounter (HOSPITAL_COMMUNITY): Payer: Medicaid Other | Admitting: Occupational Therapy

## 2022-06-12 ENCOUNTER — Ambulatory Visit: Payer: Medicaid Other | Admitting: Orthopedic Surgery

## 2022-06-14 ENCOUNTER — Encounter (HOSPITAL_COMMUNITY): Payer: Medicaid Other | Admitting: Occupational Therapy

## 2022-07-10 ENCOUNTER — Encounter: Payer: Self-pay | Admitting: Gastroenterology

## 2022-08-02 ENCOUNTER — Other Ambulatory Visit: Payer: Self-pay

## 2022-08-02 ENCOUNTER — Emergency Department (HOSPITAL_COMMUNITY)
Admission: EM | Admit: 2022-08-02 | Discharge: 2022-08-02 | Disposition: A | Discharge status: Home / Self Care | Payer: Self-pay | Attending: Emergency Medicine | Admitting: Emergency Medicine

## 2022-08-02 ENCOUNTER — Emergency Department (HOSPITAL_COMMUNITY): Payer: Self-pay

## 2022-08-02 ENCOUNTER — Encounter (HOSPITAL_COMMUNITY): Payer: Self-pay | Admitting: Emergency Medicine

## 2022-08-02 DIAGNOSIS — R7401 Elevation of levels of liver transaminase levels: Secondary | ICD-10-CM | POA: Insufficient documentation

## 2022-08-02 DIAGNOSIS — Z7901 Long term (current) use of anticoagulants: Secondary | ICD-10-CM | POA: Insufficient documentation

## 2022-08-02 DIAGNOSIS — R109 Unspecified abdominal pain: Secondary | ICD-10-CM | POA: Insufficient documentation

## 2022-08-02 LAB — HEPATIC FUNCTION PANEL
ALT: 67 U/L — ABNORMAL HIGH (ref 0–44)
AST: 38 U/L (ref 15–41)
Albumin: 4 g/dL (ref 3.5–5.0)
Alkaline Phosphatase: 53 U/L (ref 38–126)
Bilirubin, Direct: 0.1 mg/dL (ref 0.0–0.2)
Indirect Bilirubin: 0.5 mg/dL (ref 0.3–0.9)
Total Bilirubin: 0.6 mg/dL (ref 0.3–1.2)
Total Protein: 7.5 g/dL (ref 6.5–8.1)

## 2022-08-02 LAB — CBC
HCT: 41.7 % (ref 39.0–52.0)
Hemoglobin: 14.2 g/dL (ref 13.0–17.0)
MCH: 31.1 pg (ref 26.0–34.0)
MCHC: 34.1 g/dL (ref 30.0–36.0)
MCV: 91.2 fL (ref 80.0–100.0)
Platelets: 307 10*3/uL (ref 150–400)
RBC: 4.57 MIL/uL (ref 4.22–5.81)
RDW: 13.4 % (ref 11.5–15.5)
WBC: 7.3 10*3/uL (ref 4.0–10.5)
nRBC: 0 % (ref 0.0–0.2)

## 2022-08-02 LAB — URINALYSIS, ROUTINE W REFLEX MICROSCOPIC
Bilirubin Urine: NEGATIVE
Glucose, UA: NEGATIVE mg/dL
Hgb urine dipstick: NEGATIVE
Ketones, ur: NEGATIVE mg/dL
Leukocytes,Ua: NEGATIVE
Nitrite: NEGATIVE
Protein, ur: NEGATIVE mg/dL
Specific Gravity, Urine: 1.015 (ref 1.005–1.030)
pH: 6 (ref 5.0–8.0)

## 2022-08-02 LAB — BASIC METABOLIC PANEL
Anion gap: 9 (ref 5–15)
BUN: 13 mg/dL (ref 6–20)
CO2: 28 mmol/L (ref 22–32)
Calcium: 8.9 mg/dL (ref 8.9–10.3)
Chloride: 100 mmol/L (ref 98–111)
Creatinine, Ser: 0.87 mg/dL (ref 0.61–1.24)
GFR, Estimated: >60 mL/min (ref >60)
Glucose, Bld: 111 mg/dL — ABNORMAL HIGH (ref 70–99)
Potassium: 3.8 mmol/L (ref 3.5–5.1)
Sodium: 137 mmol/L (ref 135–145)

## 2022-08-02 MED ORDER — METHOCARBAMOL 500 MG PO TABS: 500.0000 mg | ORAL_TABLET | Freq: Two times a day (BID) | ORAL | 0 refills | Status: AC | PRN

## 2022-08-02 MED ORDER — DICLOFENAC SODIUM 1 % EX GEL: 2.0000 g | Freq: Four times a day (QID) | CUTANEOUS | 0 refills | Status: AC

## 2022-08-02 NOTE — ED Provider Notes (Signed)
[place]PARTMENT AT [facility] Provider Note   CSN: [id number] Arrival date & time: 08/02/22  1713     History  Chief Complaint  Patient presents with   Flank Pain    Mark Glenn is a 37 y.o. male.  He is here with a complaint of sharp stabbing right flank pain that is been going on since yesterday.  Pain is mild at rest but significant with any type of movement or walking.  He says it feels like prior kidney stones.  No fevers chills nausea vomiting no urinary symptoms including hematuria.  Has tried nothing for his symptoms.  The history is provided by the patient.  Flank Pain This is a recurrent problem. The current episode started yesterday. The problem occurs constantly. The problem has not changed since onset.Pertinent negatives include no chest pain, no abdominal pain, no headaches and no shortness of breath. The symptoms are aggravated by bending, twisting and walking. Nothing relieves the symptoms. He has tried rest for the symptoms. The treatment provided no relief.       Home Medications Prior to Admission medications   Medication Sig Start Date End Date Taking? Authorizing Provider  acetaminophen (TYLENOL) 325 MG tablet Take 2 tablets (650 mg total) by mouth every 6 (six) hours as needed for mild pain (or Fever >/= 101). 09/17/20   Shon Hale, MD  amLODipine (NORVASC) 10 MG tablet Take 1 tablet (10 mg total) by mouth daily. 09/18/20   Shon Hale, MD  apixaban (ELIQUIS) 5 MG TABS tablet Take 1 tablet (5 mg total) by mouth 2 (two) times daily. 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 05/17/22   Arthor Captain, PA-C  Garlic 1000 MG CAPS Take 1 capsule by mouth daily.    [provider]  hydrALAZINE (APRESOLINE) 50 MG tablet Take 1 tablet (50 mg total) by mouth 2 (two) times daily. 09/17/20   Shon Hale,  MD  lisinopril-hydrochlorothiazide (ZESTORETIC) 20-12.5 MG tablet Take 1 tablet by mouth daily. 09/17/20   Shon Hale, MD  Multiple Vitamin (ONE-A-DAY MENS PO) Take 1 tablet by mouth daily.    [provider]  omeprazole (PRILOSEC) 40 MG capsule Take 1 capsule (40 mg total) by mouth daily. 09/08/21   Mansouraty, Netty Starring., MD  ondansetron (ZOFRAN) 4 MG tablet Take 1 tablet (4 mg total) by mouth every 6 (six) hours. 01/13/21   Burgess Amor, PA-C      Allergies    Patient has no known allergies.    Review of Systems   Review of Systems  Constitutional:  Negative for fever.  Respiratory:  Negative for shortness of breath.   Cardiovascular:  Negative for chest pain.  Gastrointestinal:  Negative for abdominal pain, nausea and vomiting.  Genitourinary:  Positive for flank pain. Negative for dysuria and hematuria.  Musculoskeletal:  Positive for back pain.  Neurological:  Negative for headaches.    Physical Exam Updated Vital Signs BP 127/82   Pulse 77   Temp 98.6 F (37 C) (Oral)   Resp 17   Ht 5\' 9"  (1.753 m)   Wt 113.4 kg   SpO2 97%   BMI 36.92 kg/m  Physical Exam Vitals and nursing note reviewed.  Constitutional:      General: He is not in acute distress.    Appearance: Normal appearance. He is well-developed.  HENT:  Head: Normocephalic and atraumatic.  Eyes:     Conjunctiva/sclera: Conjunctivae normal.  Cardiovascular:     Rate and Rhythm: Normal rate and regular rhythm.     Heart sounds: No murmur heard. Pulmonary:     Effort: Pulmonary effort is normal. No respiratory distress.     Breath sounds: Normal breath sounds.  Abdominal:     Palpations: Abdomen is soft.     Tenderness: There is no abdominal tenderness. There is no guarding or rebound.  Musculoskeletal:        General: Normal range of motion.     Cervical back: Neck supple.  Skin:    General: Skin is warm and dry.     Capillary Refill: Capillary refill takes less than 2 seconds.   Neurological:     General: No focal deficit present.     Mental Status: He is alert.     Sensory: No sensory deficit.     Motor: No weakness.     ED Results / Procedures / Treatments   Labs (all labs ordered are listed, but only abnormal results are displayed) Labs Reviewed  BASIC METABOLIC PANEL - Abnormal; Notable for the following components:      Result Value   Glucose, Bld 111 (*)    All other components within normal limits  HEPATIC FUNCTION PANEL - Abnormal; Notable for the following components:   ALT 67 (*)    All other components within normal limits  URINALYSIS, ROUTINE W REFLEX MICROSCOPIC  CBC    EKG None  Radiology CT Renal Stone Study  Result Date: 08/02/2022 CLINICAL DATA:  Suspected. Right flank pain since last night. Prior history of kidney stones. EXAM: CT ABDOMEN AND PELVIS WITHOUT CONTRAST TECHNIQUE: Multidetector CT imaging of the abdomen and pelvis was performed following the standard protocol without IV contrast. RADIATION DOSE REDUCTION: This exam was performed according to the departmental dose-optimization program which includes automated exposure control, adjustment of the mA and/or kV according to patient size and/or use of iterative reconstruction technique. COMPARISON:  06/07/2020 FINDINGS: Lower chest: Lung bases are clear. Hepatobiliary: Diffuse fatty infiltration of the liver. No focal lesions. Gallbladder and bile ducts are normal. Pancreas: Unremarkable. No pancreatic ductal dilatation or surrounding inflammatory changes. Spleen: Normal in size without focal abnormality. Adrenals/Urinary Tract: Adrenal glands are unremarkable. Kidneys are normal, without renal calculi, focal lesion, or hydronephrosis. Bladder is unremarkable. Stomach/Bowel: Borderline dilatation of the appendix measuring 9 mm diameter. This could represent early acute appendicitis. No periappendiceal stranding or collection. Right lower quadrant lymph nodes are prominent, likely  reactive. Stomach, small bowel, and colon are not abnormally distended. Vascular/Lymphatic: No significant vascular findings are present. No enlarged abdominal or pelvic lymph nodes. Reproductive: Prostate is unremarkable. Other: No abdominal wall hernia or abnormality. No abdominopelvic ascites. Musculoskeletal: No acute or significant osseous findings. IMPRESSION: 1. Borderline enlarged appendix at 9 mm diameter. No periappendiceal stranding or fluid. Changes may represent early acute appendicitis. 2. Right lower quadrant lymph nodes are likely reactive. 3. Fatty infiltration of the liver. 4. No renal or ureteral stone or obstruction. Electronically Signed   By: Burman Nieves M.D.   On: 08/02/2022 20:39    Procedures Procedures    Medications Ordered in ED Medications - No data to display  ED Course/ Medical Decision Making/ A&P Clinical Course as of 08/03/22 1026  Thu Aug 02, 2022  2249 LFTs showing single elevation of ALT which has been seen before.  Otherwise nonobstructive pattern. [MB]    Clinical Course  User Index [MB] Terrilee Files, MD                             Medical Decision Making Amount and/or Complexity of Data Reviewed Labs: ordered. Radiology: ordered.  Risk Prescription drug management.   This patient complains of right flank pain; this involves an extensive number of treatment Options and is a complaint that carries with it a high risk of complications and morbidity. The differential includes musculoskeletal, radiculopathy, renal colic, biliary colic, pyelonephritis  I ordered, reviewed and interpreted labs, which included CBC normal, chemistries normal, LFTs normal except for mild elevation of ALT, urinalysis unremarkable I ordered imaging studies which included CT renal and I independently    visualized and interpreted imaging which showed no acute findings Previous records obtained and reviewed in epic, recent visits for shoulder pain  Cardiac  monitoring reviewed, normal sinus rhythm Social determinants considered, no significant barriers Critical Interventions: None  After the interventions stated above, I reevaluated the patient and found patient to be neurologically intact with stable vitals Admission and further testing considered, no indications for admission or further workup at this time.  Will cover patient with topical NSAIDs as is already on anticoagulation, also add muscle relaxant.  Recommended close follow-up with PCP.  Return instructions discussed         Final Clinical Impression(s) / ED Diagnoses Final diagnoses:  Right flank pain    Rx / DC Orders ED Discharge Orders          Ordered    diclofenac Sodium (VOLTAREN ARTHRITIS PAIN) 1 % GEL  4 times daily        08/02/22 2159    methocarbamol (ROBAXIN) 500 MG tablet  2 times daily PRN        08/02/22 2159              Terrilee Files, MD 08/03/22 1028

## 2022-08-02 NOTE — ED Triage Notes (Signed)
Pt reporting right flank pain since last night with hx prior kidney stones. Denies urinary symptoms, no nausea. No meds PTA. Pt notes he is on cardiac meds and thinners.

## 2022-08-02 NOTE — Discharge Instructions (Signed)
You are seen in the emergency for right flank pain.  You had lab work urinalysis and a CAT scan of your abdomen and pelvis.  There was no evidence of a kidney stone or kidney infection.  This pain is likely muscular.  We are prescribing a muscle relaxant and some topical anti-inflammatories.  Please follow-up with your regular doctor.  Return to the emergency department if any worsening or concerning symptoms

## 2022-08-03 ENCOUNTER — Encounter: Payer: Self-pay | Admitting: Hematology

## 2022-08-16 ENCOUNTER — Telehealth: Payer: Self-pay

## 2022-08-16 NOTE — Telephone Encounter (Signed)
ED Follow Up Outreach  Initial Outreach Documentation   What specific health condition(s) do you have that needs to be monitored by a doctor? Patient reports blood disorder, high blood pressure,   What are you currently doing to manage your health condition(s)? Patient reports taking medicine, but since they took my medicine I been having to take less and having a hard time getting meds.   Some of the symptoms that were reported during your emergency room visits could be managed by a primary care provider. When considering your emergency room visits, did you call your primary care doctor to report your symptoms or illness before going to the emergency room?  Patient reports no, he did not know   Do you have access to resources or alternative options to help you avoid going to the ED unless is an emergency? If not, what resources or alternative options do you feel you need?  Patient reports   Is there something that you would like to learn more about: Stress Management Anxiety management Activity Tracking Mood/Depression Monitoring blood pressure/ weight /diet Physical activity / Exercise  When is a good time to call you back in the next 2 weeks? During this call we will review the resources you need and discuss when to call your primary care doctor for health issues.  Yes, you can follow up with me in 2 weeks.

## 2022-12-01 IMAGING — DX DG CHEST 2V
2 series · 2 of 2 positions shown · non-contrast
Comparison: None.

CLINICAL DATA: Hypertension, chest pain

EXAM:
CHEST - 2 VIEW

[chest pa]
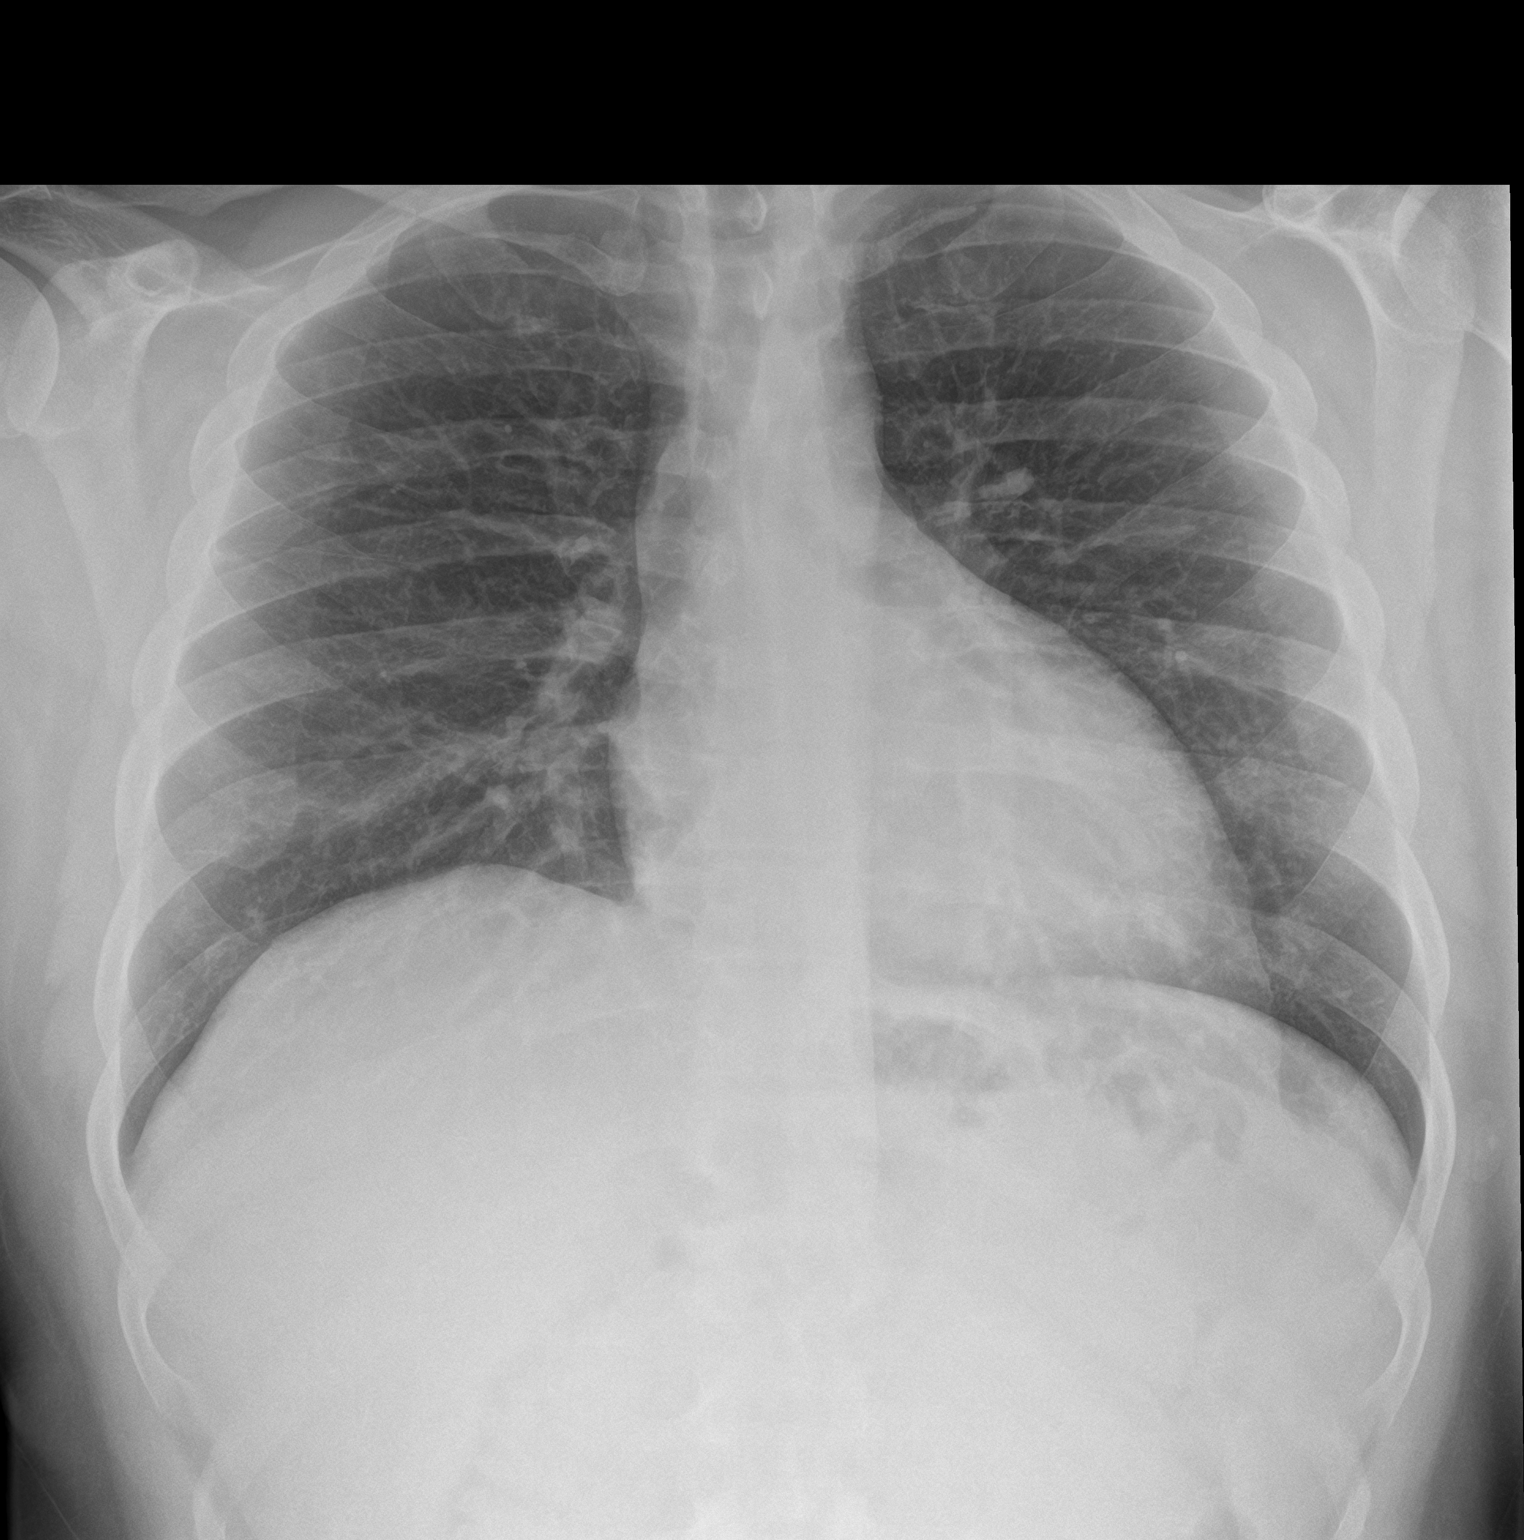

[chest lat]
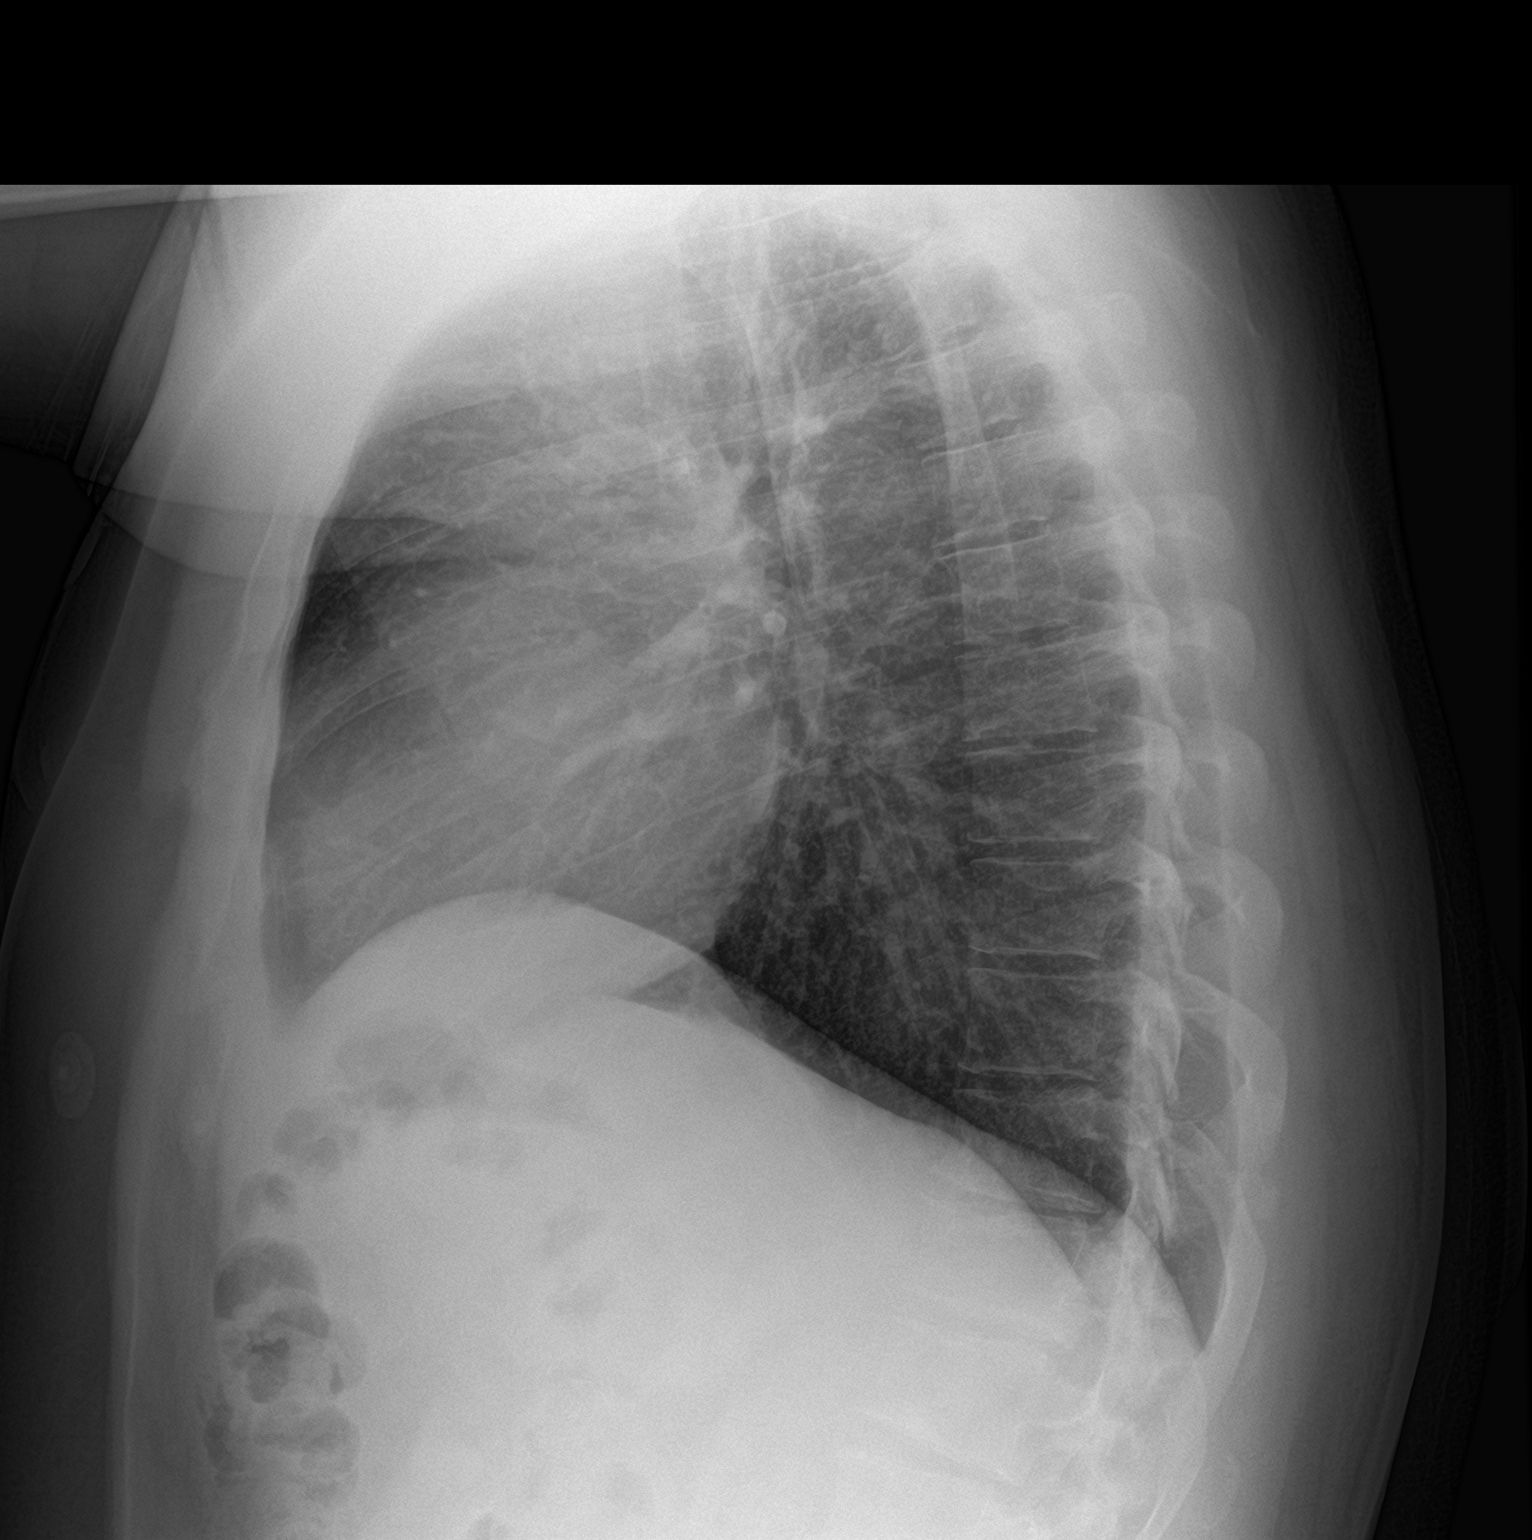

[2 of 2 positions shown; findings below may reference images not displayed]

FINDINGS: The heart size and mediastinal contours are within normal limits.
Both lungs are clear. The visualized skeletal structures are
unremarkable.
IMPRESSION: No active cardiopulmonary disease.

## 2023-01-03 ENCOUNTER — Encounter (HOSPITAL_COMMUNITY): Payer: Self-pay | Admitting: Emergency Medicine

## 2023-01-03 ENCOUNTER — Emergency Department (HOSPITAL_COMMUNITY)
Admission: EM | Admit: 2023-01-03 | Discharge: 2023-01-03 | Disposition: A | Discharge status: Home / Self Care | Payer: Self-pay | Attending: Emergency Medicine | Admitting: Emergency Medicine

## 2023-01-03 ENCOUNTER — Other Ambulatory Visit: Payer: Self-pay

## 2023-01-03 DIAGNOSIS — Z7901 Long term (current) use of anticoagulants: Secondary | ICD-10-CM | POA: Insufficient documentation

## 2023-01-03 DIAGNOSIS — Z79899 Other long term (current) drug therapy: Secondary | ICD-10-CM | POA: Insufficient documentation

## 2023-01-03 DIAGNOSIS — I10 Essential (primary) hypertension: Secondary | ICD-10-CM | POA: Insufficient documentation

## 2023-01-03 DIAGNOSIS — K0889 Other specified disorders of teeth and supporting structures: Secondary | ICD-10-CM | POA: Insufficient documentation

## 2023-01-03 MED ORDER — AMLODIPINE BESYLATE 10 MG PO TABS: 10.0000 mg | ORAL_TABLET | Freq: Every day | ORAL | 2 refills | Status: AC

## 2023-01-03 MED ORDER — AMOXICILLIN 500 MG PO CAPS: 500.0000 mg | ORAL_CAPSULE | Freq: Three times a day (TID) | ORAL | 0 refills | Status: AC

## 2023-01-03 MED ORDER — HYDROCODONE-ACETAMINOPHEN 5-325 MG PO TABS: 1.0000 | ORAL_TABLET | Freq: Four times a day (QID) | ORAL | 0 refills | Status: AC | PRN

## 2023-01-03 MED ORDER — HYDROCODONE-ACETAMINOPHEN 5-325 MG PO TABS: 2.0000 | ORAL_TABLET | ORAL | 0 refills | Status: DC | PRN

## 2023-01-03 MED ORDER — AMLODIPINE BESYLATE 5 MG PO TABS
5.0000 mg | ORAL_TABLET | Freq: Once | ORAL | Status: AC
  Administered 2023-01-03: 5 mg via ORAL
  Filled 2023-01-03: qty 1

## 2023-01-03 NOTE — ED Triage Notes (Signed)
Pt presents ambulatory to triage via POV with complaints of L side dental pain that started 1 month ago when he broke his tooth. He notes taking Tylenol as needed for pain but the pain has become more severe. Of note, the pt has a hx of HTN and has not had his medication in over 3 months due to loss of insurance. &Ox4 at this time. Denies CP or SOB.

## 2023-01-03 NOTE — ED Provider Notes (Signed)
[place] EMERGENCY DEPARTMENT AT [facility] Provider Note   CSN: [id number] Arrival date & time: 01/03/23  1911     History  Chief Complaint  Patient presents with   Dental Pain    Mark Glenn is a 37 y.o. male.  For left lower dental pain for a month, worse in the past week, states he broke a tooth and it has become more painful.  He has not seen a dentist yet.  He also has history of hypertension, not taking his medications due to lack of insurance.  Has not taken them in months he reports.  No chest pain or shortness of breath, no severe headache, no numbness tingling or weakness.   Dental Pain      Home Medications Prior to Admission medications   Medication Sig Start Date End Date Taking? Authorizing Provider  amoxicillin (AMOXIL) 500 MG capsule Take 1 capsule (500 mg total) by mouth 3 (three) times daily. 01/03/23  Yes Loron Weimer A, PA-C  HYDROcodone-acetaminophen (NORCO/VICODIN) 5-325 MG tablet Take 2 tablets by mouth every 4 (four) hours as needed. 01/03/23  Yes Korde Jeppsen A, PA-C  acetaminophen (TYLENOL) 325 MG tablet Take 2 tablets (650 mg total) by mouth every 6 (six) hours as needed for mild pain (or Fever >/= 101). 09/17/20   Shon Hale, MD  amLODipine (NORVASC) 10 MG tablet Take 1 tablet (10 mg total) by mouth daily. 01/03/23   Carmel Sacramento A, PA-C  apixaban (ELIQUIS) 5 MG TABS tablet Take 1 tablet (5 mg total) by mouth 2 (two) times daily. 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 08/02/22   Terrilee Files, MD  Garlic 1000 MG CAPS Take 1 capsule by mouth daily.    [provider]  hydrALAZINE (APRESOLINE) 50 MG tablet Take 1 tablet (50 mg total) by mouth 2 (two) times daily. 09/17/20   Shon Hale, MD  lisinopril-hydrochlorothiazide (ZESTORETIC) 20-12.5 MG tablet Take 1 tablet by mouth daily. 09/17/20    Shon Hale, MD  methocarbamol (ROBAXIN) 500 MG tablet Take 1 tablet (500 mg total) by mouth 2 (two) times daily as needed for muscle spasms. 08/02/22   Terrilee Files, MD  Multiple Vitamin (ONE-A-DAY MENS PO) Take 1 tablet by mouth daily.    [provider]  omeprazole (PRILOSEC) 40 MG capsule Take 1 capsule (40 mg total) by mouth daily. 09/08/21   Mansouraty, Netty Starring., MD  ondansetron (ZOFRAN) 4 MG tablet Take 1 tablet (4 mg total) by mouth every 6 (six) hours. 01/13/21   Burgess Amor, PA-C      Allergies    Patient has no known allergies.    Review of Systems   Review of Systems  Physical Exam Updated Vital Signs BP (!) 198/88   Pulse 60   Temp 98.6 F (37 C) (Oral)   Resp 16   Ht 5\' 9"  (1.753 m)   Wt 117.9 kg   SpO2 100%   BMI 38.40 kg/m  Physical Exam Vitals and nursing note reviewed.  Constitutional:      General: He is not in acute distress.    Appearance: He is well-developed.  HENT:     Head: Normocephalic and atraumatic.     Mouth/Throat:     Mouth: Mucous membranes are moist.     Pharynx: Oropharynx is clear. Uvula midline.   Eyes:  Conjunctiva/sclera: Conjunctivae normal.  Cardiovascular:     Rate and Rhythm: Normal rate and regular rhythm.     Heart sounds: No murmur heard. Pulmonary:     Effort: Pulmonary effort is normal. No respiratory distress.     Breath sounds: Normal breath sounds.  Abdominal:     Palpations: Abdomen is soft.     Tenderness: There is no abdominal tenderness.  Musculoskeletal:        General: No swelling.     Cervical back: Neck supple.  Skin:    General: Skin is warm and dry.     Capillary Refill: Capillary refill takes less than 2 seconds.  Neurological:     Mental Status: He is alert.  Psychiatric:        Mood and Affect: Mood normal.     ED Results / Procedures / Treatments   Labs (all labs ordered are listed, but only abnormal results are displayed) Labs Reviewed - No data to  display  EKG None  Radiology No results found.  Procedures Procedures    Medications Ordered in ED Medications  amLODipine (NORVASC) tablet 5 mg (5 mg Oral Given 01/03/23 2259)    ED Course/ Medical Decision Making/ A&P                                 Medical Decision Making Ddx: dental abscess, dental caries, dental fracture, alveolar osteitis, ludwigs angina, other ED course: Patient present with pain in the left lower molar. They speak in full and clear sentences,  handle their oral secretions well. There is no sign of deep space abscess. There is no drainable collection.  Pain management: Pt given pre pack norco for pain control Antibiotics provided for dental infection, patient was provided with outpatient dental resources and advised to come back to the ED for any new or worsening symptoms.   Patient be discharged by nursing, noted that he had left without his pre pack, sent rx to pharmacy instead, Pt noted to have very high blood pressure today. They have no symptoms, including no chest pain, SOB, vision change, numbness/tingling/weakness. They were made aware of the value and the need to follow up with primary care in 24-48 hours, and to return immediately if they develop any symptoms.  Given prescription to restart his Norvasc    Risk Prescription drug management.           Final Clinical Impression(s) / ED Diagnoses Final diagnoses:  Pain, dental  Hypertension, unspecified type    Rx / DC Orders ED Discharge Orders          Ordered    amLODipine (NORVASC) 10 MG tablet  Daily        01/03/23 2243    amoxicillin (AMOXIL) 500 MG capsule  3 times daily        01/03/23 2244    HYDROcodone-acetaminophen (NORCO/VICODIN) 5-325 MG tablet  Every 4 hours PRN        01/03/23 2244              Ma Rings, PA-C 01/03/23 2334    Vanetta Mulders, MD 01/15/23 [id number]

## 2023-01-03 NOTE — ED Notes (Signed)
Patient reports he cannot afford to buy his blood  pressure medication because he doesn't have insurance. Reviewed with him measures  on lowering his blood pressure and dietary changes including low sodium foods. Encouraged him on the importance of taking his medications as prescribed.

## 2023-01-03 NOTE — Discharge Instructions (Addendum)
You are seen in the ER today for dental pain on the left side.  You likely have an infection in the tooth.  You are being prescribed antibiotics for this.  You to follow-up with a dentist as you will likely need to have the tooth pulled.  Your blood pressure is also very high.  You need to restart taking your blood pressure medication.  As you have been off pain.  You need to follow-up with primary care to restart the others in discussed with them which would be most affordable.

## 2023-09-02 IMAGING — US US ABDOMEN COMPLETE
1 series · 14 of 25 positions shown · non-contrast
Comparison: None.

CLINICAL DATA: Upper abdominal pain and epigastric pain.

EXAM:
ABDOMEN ULTRASOUND COMPLETE

[Series 1: us abdomen complete · 14 of 91 slices shown]
[im 1/91]
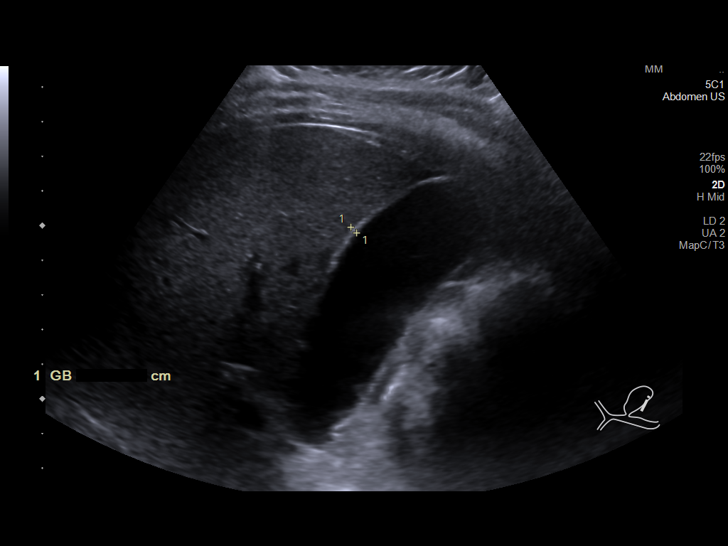
[im 8/91]
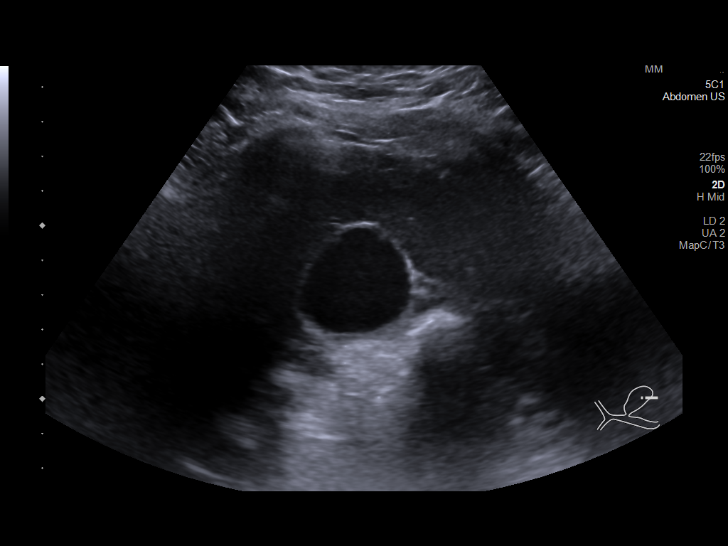
[im 16/91]
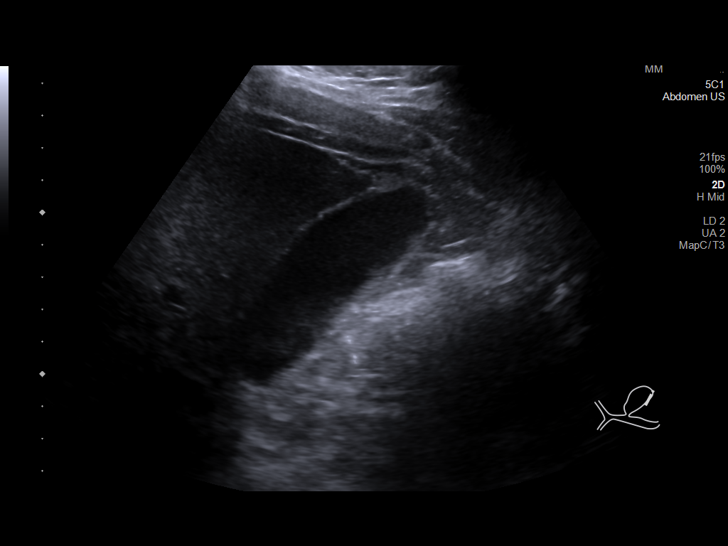
[im 23/91]
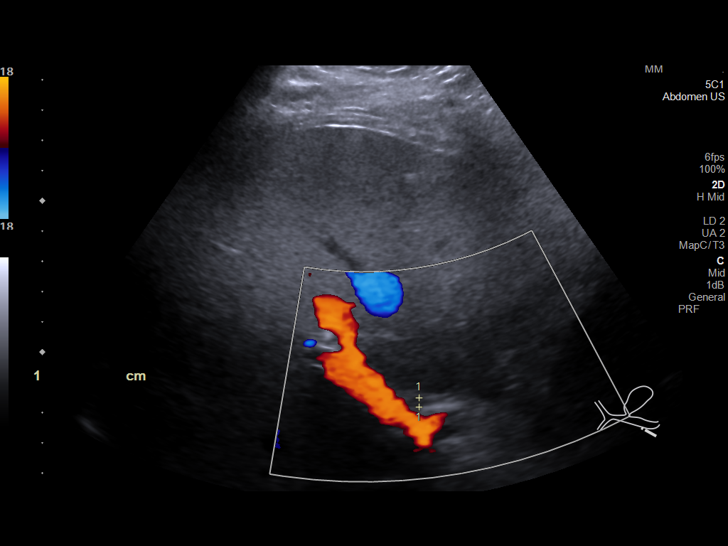
[im 31/91]
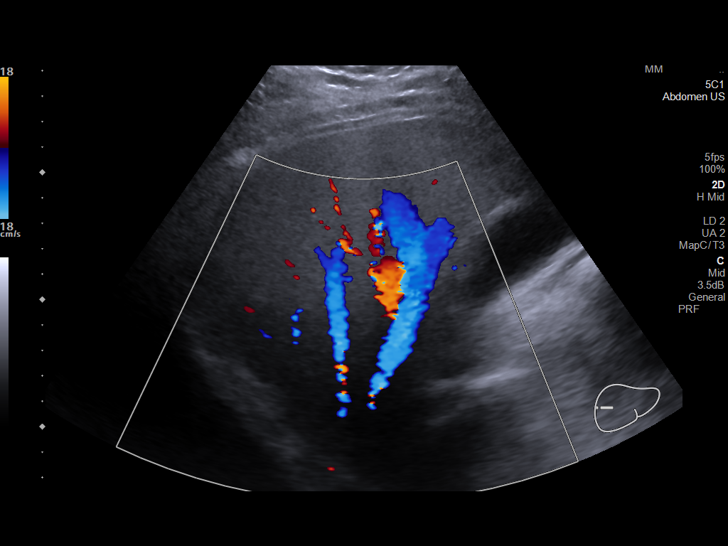
[im 34/91]
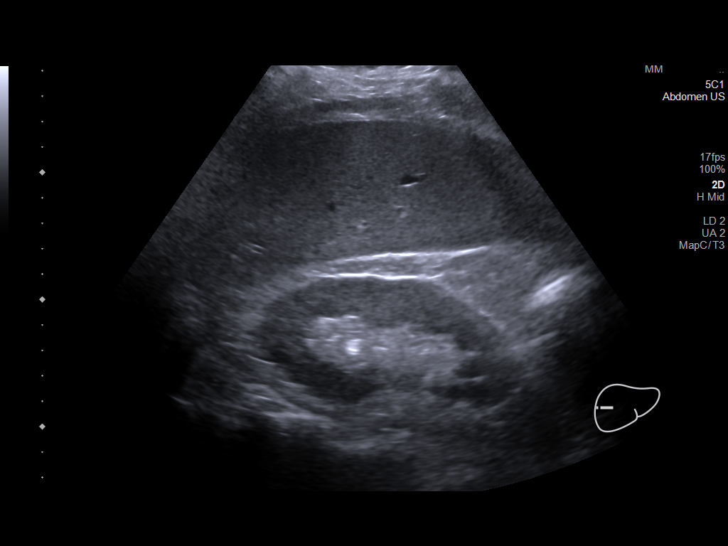
[im 42/91]
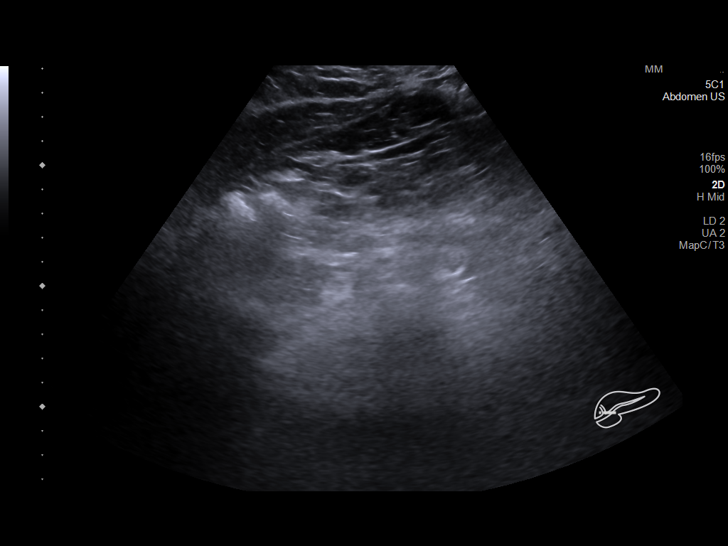
[im 49/91]
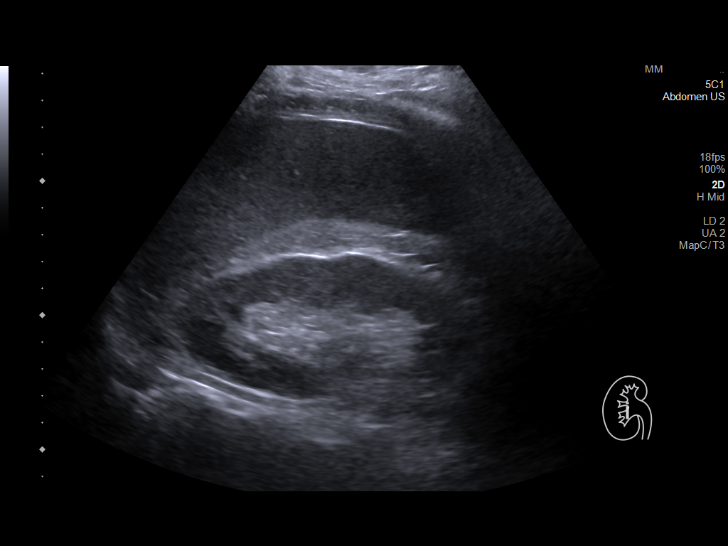
[im 57/91]
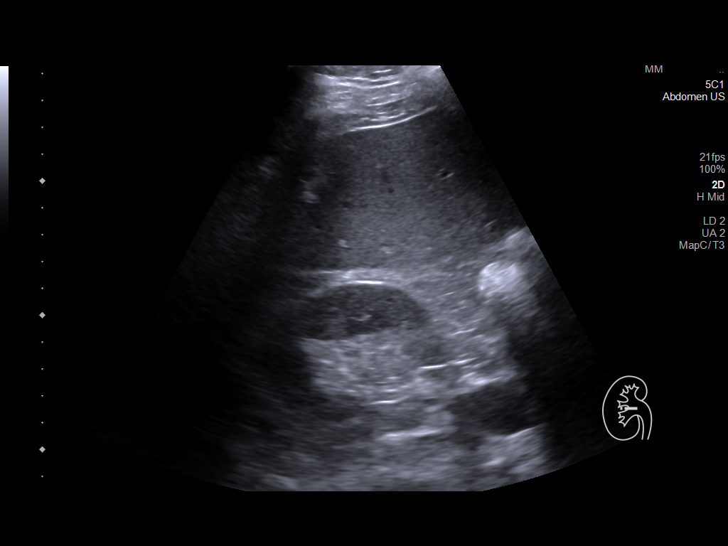
[im 61/91]
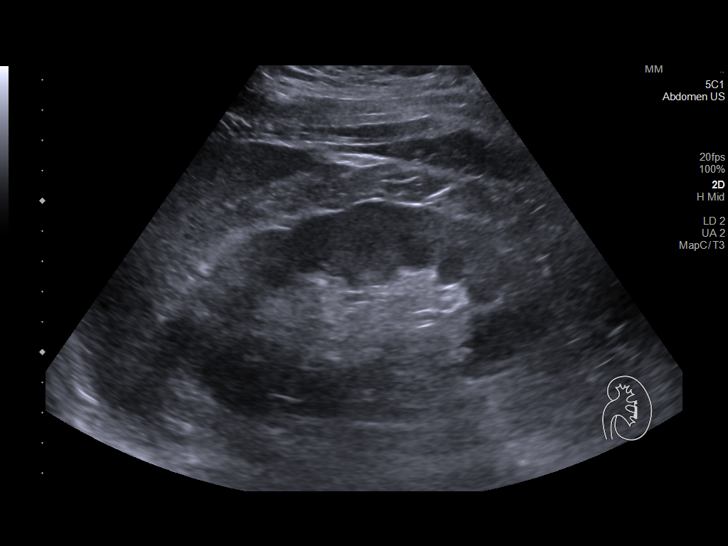
[im 68/91]
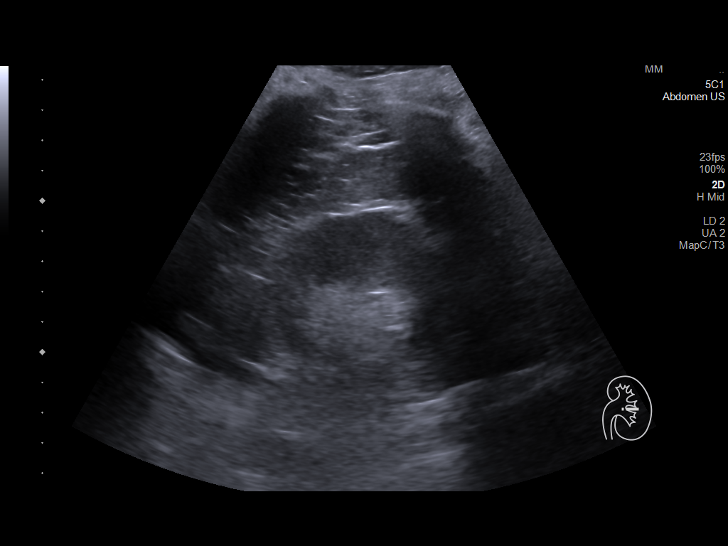
[im 76/91]
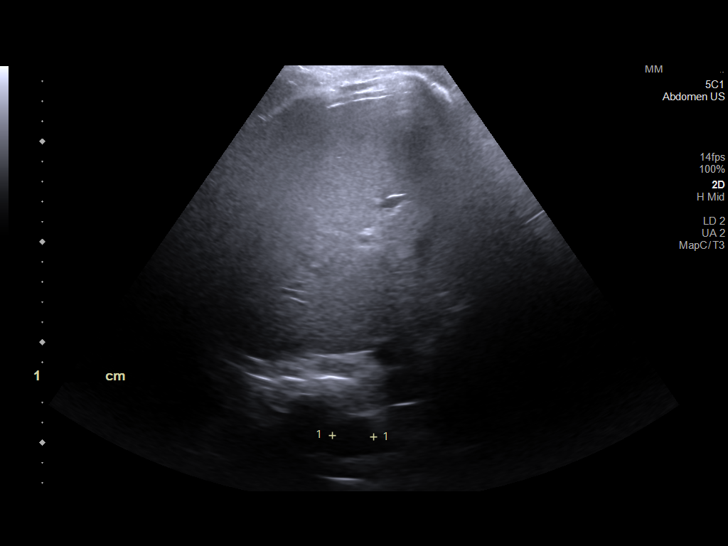
[im 83/91]
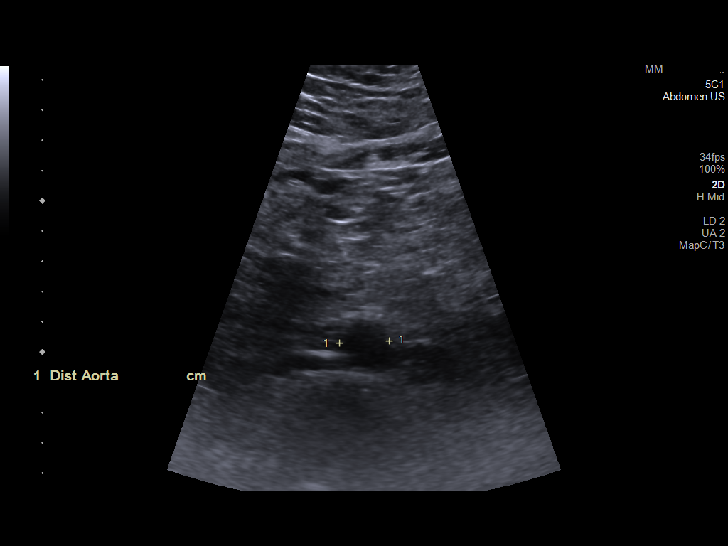
[im 91/91]
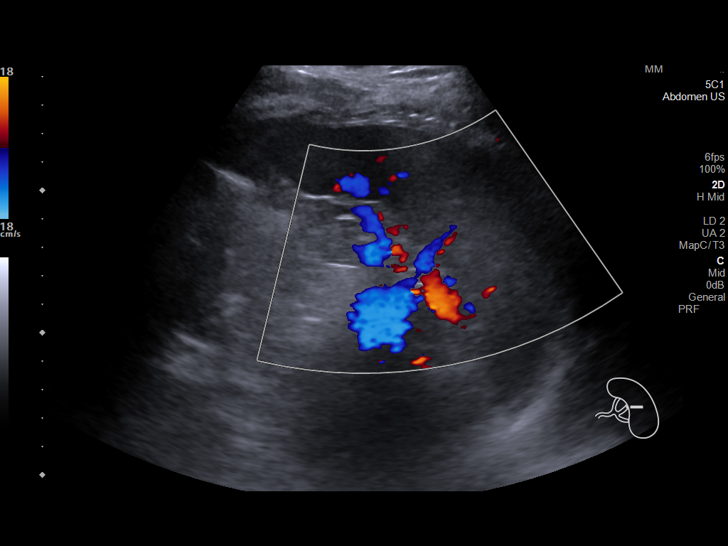

[14 of 25 positions shown; findings below may reference images not displayed]

FINDINGS: Gallbladder: A very small amount of echogenic sludge is suspected
within the gallbladder lumen. No gallstones or wall thickening
visualized (2.3 mm). No sonographic Murphy sign noted by
sonographer.

Common bile duct: Diameter: 3.2 mm

Liver: No focal lesion identified. Diffusely increased echogenicity
of the liver parenchyma is noted. Portal vein is patent on color
Doppler imaging with normal direction of blood flow towards the
liver.

IVC: No abnormality visualized.

Pancreas: Limited in evaluation secondary to overlying bowel gas.

Spleen: Size (8.1 cm) and appearance within normal limits.

Right Kidney: Length: 11.6 cm. Echogenicity within normal limits. No
mass or hydronephrosis visualized.

Left Kidney: Length: 12.7 cm. Echogenicity within normal limits. No
mass or hydronephrosis visualized.

Abdominal aorta: No aneurysm visualized (2.3 cm in AP diameter).

Other findings: The study is limited secondary to overlying bowel
gas.
IMPRESSION: 1. Small amount of gallbladder sludge.
2. Hepatic steatosis without focal liver lesions.

## 2023-10-02 ENCOUNTER — Other Ambulatory Visit: Payer: Self-pay

## 2023-10-11 ENCOUNTER — Encounter: Payer: Self-pay | Admitting: Radiology

## 2023-12-23 ENCOUNTER — Encounter: Payer: Self-pay | Admitting: Radiology

## 2024-02-22 IMAGING — US US EXTREM LOW VENOUS*L*
1 series · 13 of 24 positions shown · non-contrast
Comparison: None Available.

CLINICAL DATA: f/u DVT



[Series 1: us venous img lower uni left (dvt) · portal-venous · 13 of 41 slices shown]
[im 1/41]
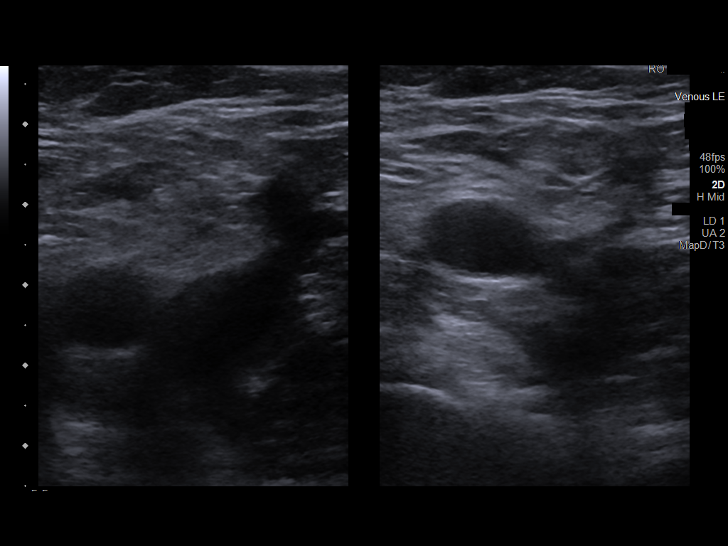
[im 4/41]
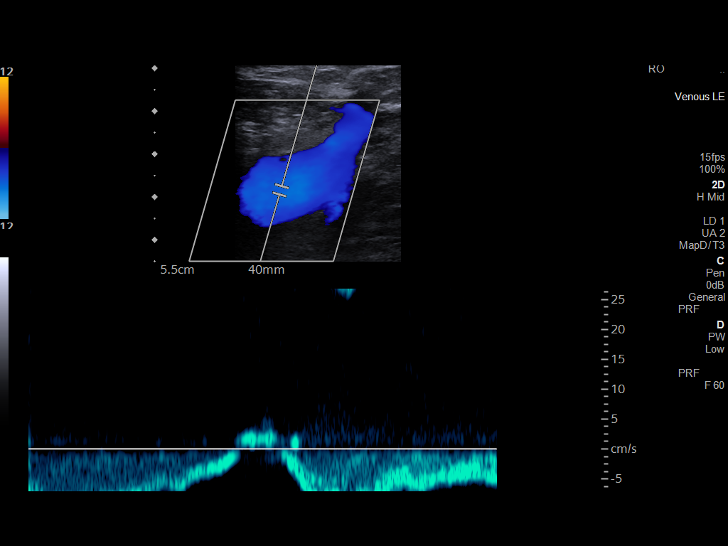
[im 7/41]
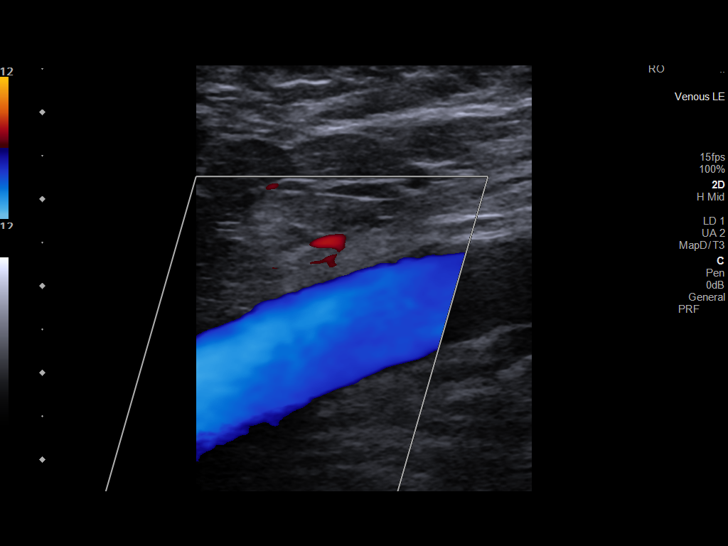
[im 11/41]
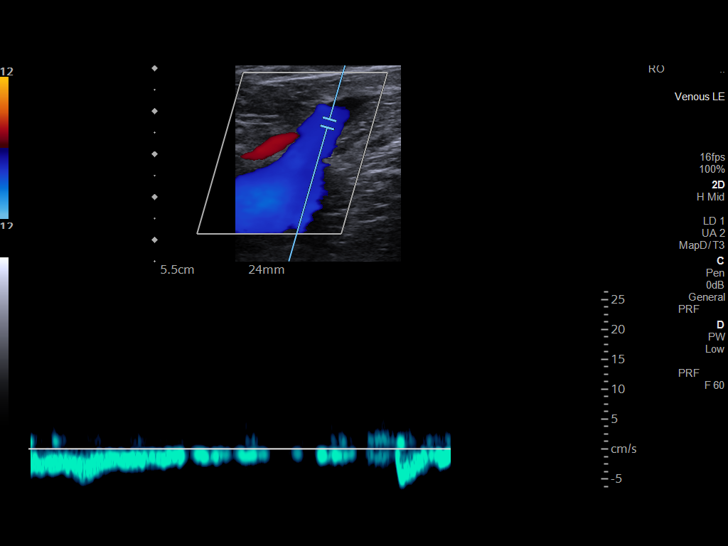
[im 14/41]
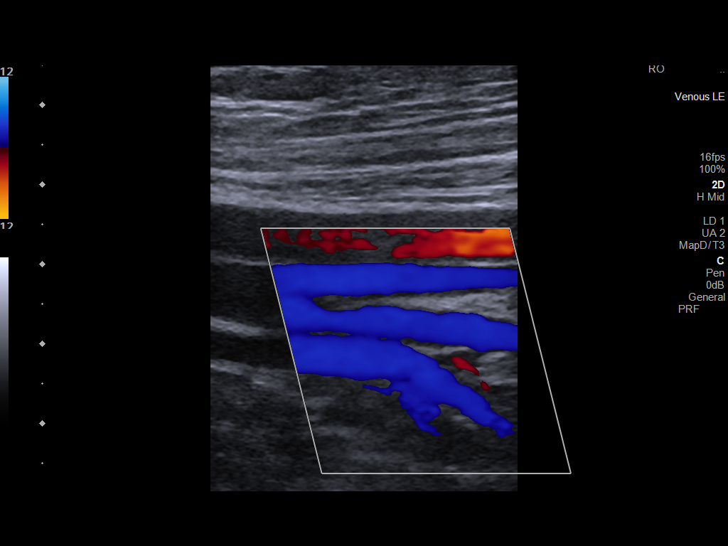
[im 18/41]
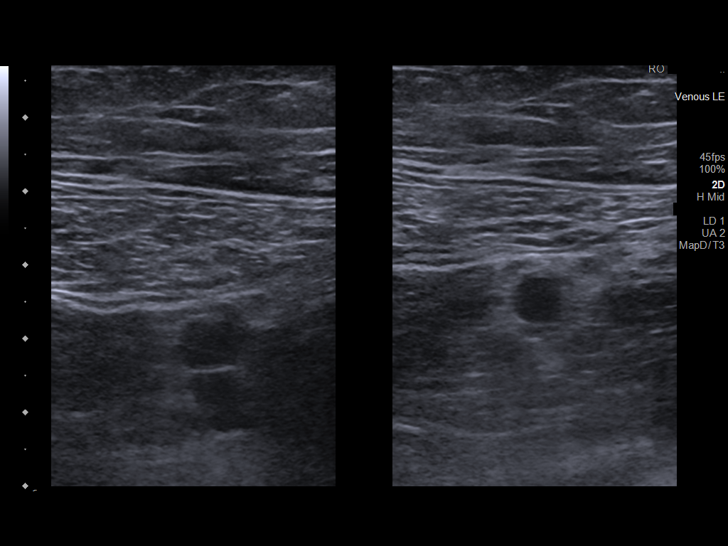
[im 21/41]
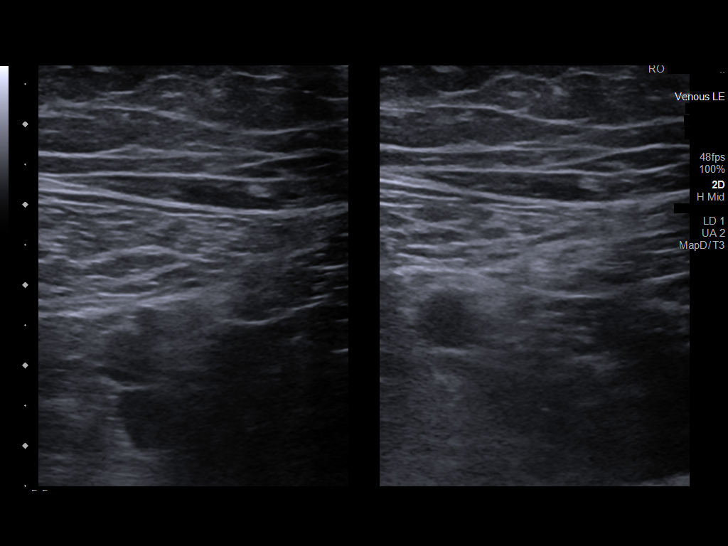
[im 23/41]
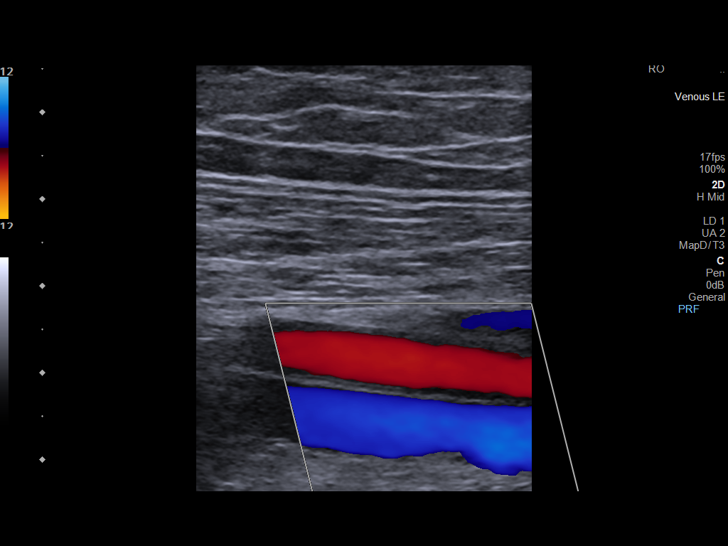
[im 27/41]
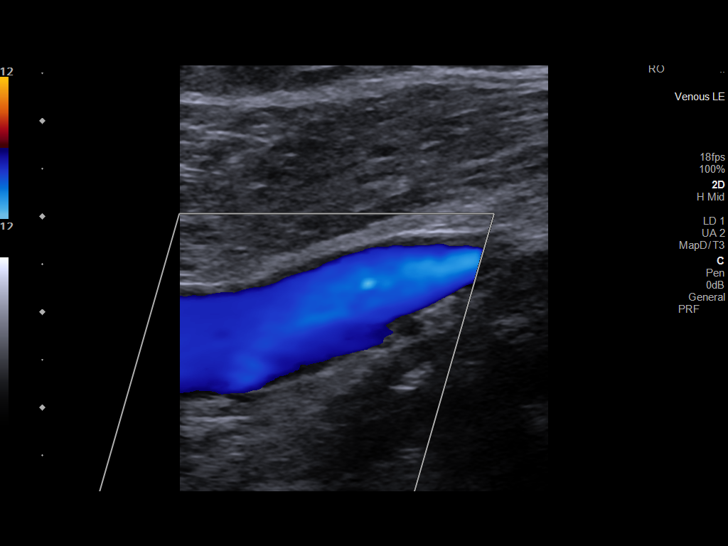
[im 30/41]
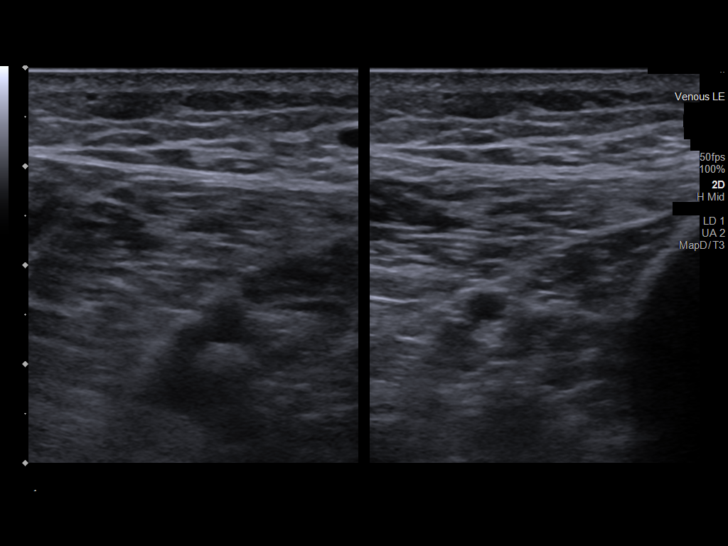
[im 34/41]
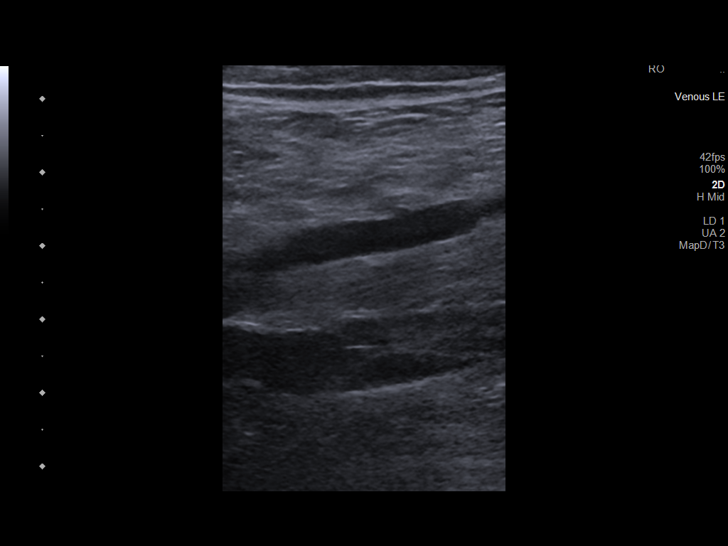
[im 37/41]
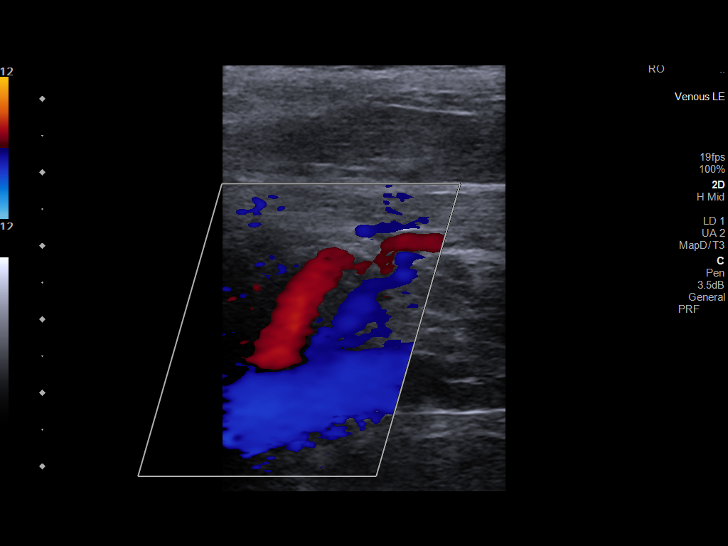
[im 41/41]
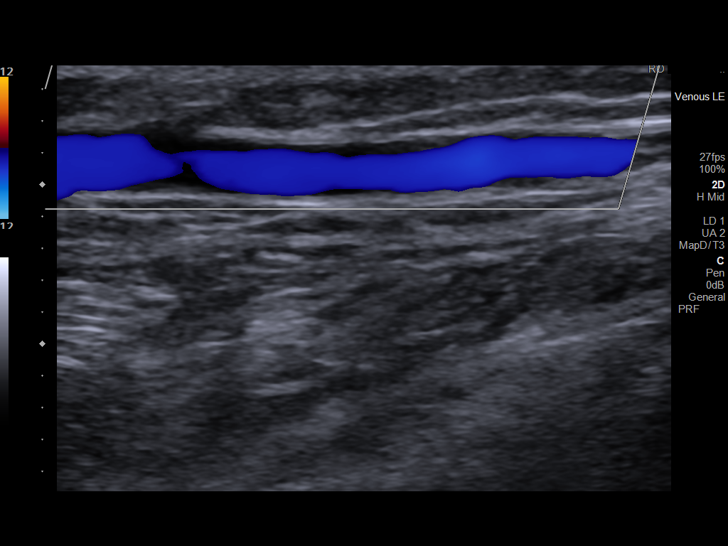

[13 of 24 positions shown; findings below may reference images not displayed]

FINDINGS: Contralateral Common Femoral Vein: Respiratory phasicity is normal
and symmetric with the symptomatic side. No evidence of thrombus.
Normal compressibility.

Common Femoral Vein: No evidence of thrombus. Normal
compressibility, respiratory phasicity and response to augmentation.

Saphenofemoral Junction: No evidence of thrombus. Normal
compressibility and flow on color Doppler imaging.

Profunda Femoral Vein: No evidence of thrombus. Normal
compressibility and flow on color Doppler imaging.

Femoral Vein: No evidence of thrombus. Normal compressibility,
respiratory phasicity and response to augmentation.

Popliteal Vein: No evidence of thrombus. Normal compressibility,
respiratory phasicity and response to augmentation.

Calf Veins: No evidence of thrombus. Normal compressibility and flow
on color Doppler imaging.

Other Findings:  None.
IMPRESSION: No evidence of deep venous thrombosis.
